# Patient Record
Sex: Female | Born: 1975 | Race: Black or African American | Hispanic: No | Marital: Single | Smoking: Never smoker
Health system: Southern US, Community
[De-identification: ages and names within clinical notes are randomized; demographics above are authoritative.]

## PROBLEM LIST (undated history)

## (undated) DIAGNOSIS — F419 Anxiety disorder, unspecified: Secondary | ICD-10-CM

## (undated) DIAGNOSIS — M549 Dorsalgia, unspecified: Secondary | ICD-10-CM

## (undated) DIAGNOSIS — F32A Depression, unspecified: Secondary | ICD-10-CM

## (undated) DIAGNOSIS — Z91018 Allergy to other foods: Secondary | ICD-10-CM

## (undated) DIAGNOSIS — K219 Gastro-esophageal reflux disease without esophagitis: Secondary | ICD-10-CM

## (undated) DIAGNOSIS — E559 Vitamin D deficiency, unspecified: Secondary | ICD-10-CM

## (undated) DIAGNOSIS — F329 Major depressive disorder, single episode, unspecified: Secondary | ICD-10-CM

## (undated) DIAGNOSIS — G43909 Migraine, unspecified, not intractable, without status migrainosus: Secondary | ICD-10-CM

## (undated) DIAGNOSIS — R079 Chest pain, unspecified: Secondary | ICD-10-CM

## (undated) DIAGNOSIS — J302 Other seasonal allergic rhinitis: Secondary | ICD-10-CM

## (undated) DIAGNOSIS — E739 Lactose intolerance, unspecified: Secondary | ICD-10-CM

## (undated) DIAGNOSIS — K5900 Constipation, unspecified: Secondary | ICD-10-CM

## (undated) DIAGNOSIS — D219 Benign neoplasm of connective and other soft tissue, unspecified: Secondary | ICD-10-CM

## (undated) HISTORY — DX: Depression, unspecified: F32.A

## (undated) HISTORY — DX: Other seasonal allergic rhinitis: J30.2

## (undated) HISTORY — DX: Chest pain, unspecified: R07.9

## (undated) HISTORY — DX: Anxiety disorder, unspecified: F41.9

## (undated) HISTORY — DX: Migraine, unspecified, not intractable, without status migrainosus: G43.909

## (undated) HISTORY — DX: Vitamin D deficiency, unspecified: E55.9

## (undated) HISTORY — DX: Benign neoplasm of connective and other soft tissue, unspecified: D21.9

## (undated) HISTORY — DX: Lactose intolerance, unspecified: E73.9

## (undated) HISTORY — DX: Constipation, unspecified: K59.00

## (undated) HISTORY — DX: Dorsalgia, unspecified: M54.9

## (undated) HISTORY — DX: Allergy to other foods: Z91.018

## (undated) HISTORY — DX: Gastro-esophageal reflux disease without esophagitis: K21.9

---
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
## 2004-04-13 ENCOUNTER — Other Ambulatory Visit: Admission: RE | Admit: 2004-04-13 | Discharge: 2004-04-13 | Payer: Self-pay | Admitting: Obstetrics and Gynecology

## 2005-06-15 ENCOUNTER — Other Ambulatory Visit: Admission: RE | Admit: 2005-06-15 | Discharge: 2005-06-15 | Payer: Self-pay | Admitting: Obstetrics and Gynecology

## 2006-06-24 ENCOUNTER — Other Ambulatory Visit: Admission: RE | Admit: 2006-06-24 | Discharge: 2006-06-24 | Payer: Self-pay | Admitting: Obstetrics and Gynecology

## 2007-07-01 ENCOUNTER — Other Ambulatory Visit: Admission: RE | Admit: 2007-07-01 | Discharge: 2007-07-01 | Payer: Self-pay | Admitting: Obstetrics and Gynecology

## 2008-07-07 ENCOUNTER — Other Ambulatory Visit: Admission: RE | Admit: 2008-07-07 | Discharge: 2008-07-07 | Payer: Self-pay | Admitting: Obstetrics and Gynecology

## 2009-08-17 ENCOUNTER — Other Ambulatory Visit: Admission: RE | Admit: 2009-08-17 | Discharge: 2009-08-17 | Payer: Self-pay | Admitting: Obstetrics and Gynecology

## 2010-09-20 ENCOUNTER — Other Ambulatory Visit
Admission: RE | Admit: 2010-09-20 | Discharge: 2010-09-20 | Payer: Self-pay | Source: Home / Self Care | Admitting: Obstetrics and Gynecology

## 2010-09-20 ENCOUNTER — Other Ambulatory Visit: Payer: Self-pay | Admitting: Obstetrics and Gynecology

## 2011-11-13 ENCOUNTER — Other Ambulatory Visit: Payer: Self-pay | Admitting: Obstetrics and Gynecology

## 2011-11-13 ENCOUNTER — Other Ambulatory Visit (HOSPITAL_COMMUNITY)
Admission: RE | Admit: 2011-11-13 | Discharge: 2011-11-13 | Disposition: A | Discharge status: Home / Self Care | Payer: BC Managed Care – PPO | Source: Ambulatory Visit | Attending: Obstetrics and Gynecology | Admitting: Obstetrics and Gynecology

## 2011-11-13 DIAGNOSIS — Z01419 Encounter for gynecological examination (general) (routine) without abnormal findings: Secondary | ICD-10-CM | POA: Insufficient documentation

## 2012-11-12 ENCOUNTER — Other Ambulatory Visit (HOSPITAL_COMMUNITY)
Admission: RE | Admit: 2012-11-12 | Discharge: 2012-11-12 | Disposition: A | Discharge status: Home / Self Care | Payer: BC Managed Care – PPO | Source: Ambulatory Visit | Attending: Obstetrics and Gynecology | Admitting: Obstetrics and Gynecology

## 2012-11-12 ENCOUNTER — Other Ambulatory Visit: Payer: Self-pay | Admitting: Obstetrics and Gynecology

## 2012-11-12 DIAGNOSIS — Z01419 Encounter for gynecological examination (general) (routine) without abnormal findings: Secondary | ICD-10-CM | POA: Insufficient documentation

## 2012-11-12 DIAGNOSIS — Z1151 Encounter for screening for human papillomavirus (HPV): Secondary | ICD-10-CM | POA: Insufficient documentation

## 2013-08-11 ENCOUNTER — Encounter: Payer: Self-pay | Admitting: Neurology

## 2013-08-11 ENCOUNTER — Ambulatory Visit (INDEPENDENT_AMBULATORY_CARE_PROVIDER_SITE_OTHER): Payer: BC Managed Care – PPO | Admitting: Neurology

## 2013-08-11 VITALS — BP 110/70 | HR 68 | Temp 99.1°F | Ht 65.5 in | Wt 200.0 lb

## 2013-08-11 DIAGNOSIS — G43109 Migraine with aura, not intractable, without status migrainosus: Secondary | ICD-10-CM

## 2013-08-11 MED ORDER — TOPIRAMATE 25 MG PO TABS: 25.0000 mg | ORAL_TABLET | Freq: Every day | ORAL | Status: DC

## 2013-08-11 NOTE — Progress Notes (Signed)
NEUROLOGY CONSULTATION NOTE  Satya Bohall MRN: [id number] DOB: Aug 21, 1976  Referring provider: Dr. Wynelle Link Primary care provider: Dr. Wynelle Link  Reason for consult:  Migraines  HISTORY OF PRESENT ILLNESS: Carolyn Shaw is a 37 year old left-handed woman with history of migraines, uterine fibroids, allergic rhinitis, and GERD who presents for headaches.  Records and images were personally reviewed where available.   Onset:  Adolescence but formally diagnosed in 2002 Location:  Left periorbital region Quality:  Squeezing sensation of the eye followed by stabbing and pounding around the eye. Intensity:  7-8/10 Associated symptoms:  photophobia, nausea, phonophobia.  No osmophobia, eye lacrimation or nasal congestion. Aura:  Visual field loss from the left moving to the right side leaving a left field cut briefly Duration:  All day without medication (1-3 hours with medication) Frequency:  Twice a week over the past 1 to 2 years. Activity:  Lay down but able to force self to be active if needed Triggers/exacerbating factors:  Lights, noise Relieving factors:  Laying down.  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  PAST MEDICAL HISTORY: Past Medical History  Diagnosis Date  . Migraines     PAST SURGICAL HISTORY: No past surgical history on file.  MEDICATIONS: No current outpatient prescriptions on file prior to visit.   No current facility-administered medications on file prior to visit.    ALLERGIES: Allergies  Allergen Reactions  . Chocolate Hives    FAMILY  HISTORY: No family history on file.  SOCIAL HISTORY: History   Social History  . Marital Status: Unknown    Spouse Name: N/A    Number of Children: N/A  . Years of Education: N/A   Occupational History  . Not on file.   Social History Main Topics  . Smoking status: Never Smoker   . Smokeless tobacco: Never Used  . Alcohol Use: No  . Drug Use: No  . Sexual Activity: Not on file   Other Topics Concern  . Not on file   Social History Narrative  . No narrative on file    REVIEW OF SYSTEMS: Constitutional: No fevers, chills, or sweats, no generalized fatigue, change in appetite Eyes: No visual changes, double vision, eye pain Ear, nose and throat: No hearing loss, ear pain, nasal congestion, sore throat Cardiovascular: No chest pain, palpitations Respiratory:  No shortness of breath at rest or with exertion, wheezes GastrointestinaI: No nausea, vomiting, diarrhea, abdominal pain, fecal incontinence Genitourinary:  No dysuria, urinary retention or frequency Musculoskeletal:  No neck pain, back pain Integumentary: No rash, pruritus, skin lesions Neurological: as above Psychiatric: No depression, insomnia, anxiety Endocrine: No palpitations, fatigue, diaphoresis, mood swings, change in appetite, change in weight, increased thirst Hematologic/Lymphatic:  No anemia, purpura, petechiae. Allergic/Immunologic: no itchy/runny eyes, nasal congestion, recent allergic reactions, rashes  PHYSICAL EXAM: Filed Vitals:   08/11/13 0756  BP: 110/70  Pulse: 68  Temp: 99.1 F (37.3 C)   General: No acute distress Head:  Normocephalic/atraumatic Neck: supple, no paraspinal tenderness, full range of motion Back: No paraspinal tenderness Heart: regular rate and rhythm Lungs: Clear to auscultation bilaterally. Vascular: No carotid bruits. Neurological Exam: Mental  status: alert and oriented to person, place, and time, speech fluent and not dysarthric, language intact. Cranial  nerves: CN I: not tested CN II: pupils equal, round and reactive to light, visual fields intact, fundi unremarkable. CN III, IV, VI:  full range of motion, no nystagmus, no ptosis CN V: facial sensation intact CN VII: upper and lower face symmetric CN VIII: hearing intact CN IX, X: gag intact, uvula midline CN XI: sternocleidomastoid and trapezius muscles intact CN XII: tongue midline Bulk & Tone: normal, no fasciculations. Motor: 5/5 throughout Sensation: pinprick and vibration intact Deep Tendon Reflexes: 2+ throughout, toes down Finger to nose testing: normal Heel to shin: normal Gait: normal stance and stride.  Able to walk on toes, heels and in tandem. Romberg negative.  IMPRESSION: Episodic migraine with aura, now more frequent.  PLAN: 1.  Start topamax 25mg  at bedtime.  Side effects discussed.  To call in 4 weeks with update. 2.  For abortive therapy, continue Maxalt but to take an NSAID along with the first dose of Maxalt to see if it aborts it better. 3.  Limit use of pain meds to no more than 2 days out of week. 4.  Follow up in 3 months.   Thank you for allowing me to take part in the care of this patient.  Shon Millet, DO  CC:  Carolyn James, MD

## 2013-08-11 NOTE — Patient Instructions (Signed)
1.  We will start topiramate (Topamax) 25mg  at bedtime.  Possible side effects include: impaired thinking, sedation, paresthesias (numbness and tingling) and weight loss.  It may cause dehydration and there is a small risk for kidney stones, so make sure to stay hydrated with water during the day.  There is also a very small risk for glaucoma, so if you notice any change in your vision while taking this medication, see an ophthalmologist.  There is also a very small risk of possible suicidal ideation, as it the case with all antiepileptic medications.  Pregnancy Guidelines 1. If any medication changes are to be made, they should be completed several months before conception. Lamictal (lamotrigine) and Trileptal (oxcarbazepine) levels, in particular, need to carefully monitored (at least once a month) during pregnancy as drug levels for these two medications tend to decrease by at least 30% during pregnancy. 2. The risk of fetal malformation is increased in women taking seizure medications: However, the risk is lower in Topamax, when compared to older antiepileptic medications.  The risk is approximately 3% compared to 1-2% in the general population. 3. Women with epilepsy planning a pregnancy should take 5 mg/day of folic acid in the preconception period and throughout pregnancy. 4. Vitamin K (20 mg/day) should be used in the last month of pregnancy in women on enzyme-inducing seizure medications (such as Topamax).  Infants should receive 1 mg of vitamin K intramuscularly at birth, to prevent hemorrhagic disease of the newborn. 5. Overbreathing (hyperventilation), sleep deprivation, pain, and emotional stress increase the risk of seizures during labor.  Consider epidural anesthesia early in the labor. 6. All currently available seizure medications can be taken while breast feeding.  2.  Continue Maxalt to abort the headache.  However, take an NSAID (like ibuprofen or naproxen) with the first dose of  Maxalt to see if it aborts the headache quicker so you don't need the second Maxalt.  3.  Take pain meds no more than 2 days out of week to prevent rebound headache. 4.  Follow up in 3 months but call in 4 weeks with update.

## 2013-10-02 ENCOUNTER — Telehealth: Payer: Self-pay | Admitting: Neurology

## 2013-10-19 NOTE — Telephone Encounter (Signed)
Patient wants to know if it is ok to not take Topamax during her monthly cycle ? And she also is asking if Topamax causes you to stay awake at night ? Please  Advise

## 2013-10-19 NOTE — Telephone Encounter (Signed)
Pt called again today and she stated that she has yet to hear from a nurse. She would like to  Speak to a nurse regarding her medication.

## 2013-10-20 NOTE — Telephone Encounter (Signed)
She will stay on the Topamax at this time as it is helping .

## 2013-10-20 NOTE — Telephone Encounter (Signed)
1.  Topamax should not be stopped if taken for migraine prevention.  Therefore, it really should be taken even during her cycle. 2.  It is possible that Topamax may cause insomnia.  Does she wish to stop the Topamax then?  If so, then we can stop the Topamax and start propranolol 40mg  twice daily instead (40mg  tablets twice daily, #60, R 1).  Propranolol is a blood pressure medication that is very effective in treating migraine.  Side effects may include sleepiness, lightheadedness, dizziness and reduced heart rate.  It is commonly used for migraine.

## 2013-11-10 ENCOUNTER — Ambulatory Visit: Payer: BC Managed Care – PPO | Admitting: Neurology

## 2013-11-16 ENCOUNTER — Ambulatory Visit (INDEPENDENT_AMBULATORY_CARE_PROVIDER_SITE_OTHER): Payer: BC Managed Care – PPO | Admitting: Neurology

## 2013-11-16 ENCOUNTER — Ambulatory Visit: Payer: BC Managed Care – PPO | Admitting: Neurology

## 2013-11-16 ENCOUNTER — Encounter: Payer: Self-pay | Admitting: Neurology

## 2013-11-16 VITALS — BP 118/70 | HR 68 | Temp 97.5°F | Resp 18 | Ht 65.5 in | Wt 197.7 lb

## 2013-11-16 DIAGNOSIS — G43109 Migraine with aura, not intractable, without status migrainosus: Secondary | ICD-10-CM

## 2013-11-16 NOTE — Progress Notes (Signed)
NEUROLOGY FOLLOW UP OFFICE NOTE  Carolyn Shaw [id number]  HISTORY OF PRESENT ILLNESS: Carolyn Shaw is a 38 year old left-handed woman with history of migraines, uterine fibroids, allergic rhinitis, and GERD who follows up for episodic migraine with aura.  Records and images were personally reviewed where available.    UPDATE: Started topirmate 08/11/13.  Doing very well.  Last migraine was February 16.  Notes some slight blurred vision.  Getting eyes checked soon.  Current abortive therapy:  Maxalt 10mg , helped when took the second dose. Current preventative therapy:  topiramate 25mg  daily  HISTORY: Onset:  Adolescence but formally diagnosed in 2002 Location:  Left periorbital region Quality:  Squeezing sensation of the eye followed by stabbing and pounding around the eye. Initial Intensity:  7-8/10 Associated symptoms:  photophobia, nausea, phonophobia.  No osmophobia, eye lacrimation or nasal congestion. Aura:  Visual field loss from the left moving to the right side leaving a left field cut briefly Initial Duration:  All day without medication (1-3 hours with medication) Initial Frequency:  Twice a week over the past 1 to 2 years. Activity:  Lay down but able to force self to be active if needed Triggers/exacerbating factors:  Lights, noise Relieving factors:  Laying down.  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  PAST MEDICAL HISTORY: Past Medical History  Diagnosis Date  . Migraines     MEDICATIONS: Current Outpatient Prescriptions on File Prior to Visit  Medication Sig Dispense Refill  . cholecalciferol (VITAMIN D) 1000 UNITS tablet Take 1,000 Units by mouth daily.      . cyclobenzaprine (FLEXERIL) 10 MG tablet Take 10 mg by mouth 3  (three) times daily as needed for muscle spasms.      Marland Kitchen ibuprofen (ADVIL,MOTRIN) 800 MG tablet Take 800 mg by mouth every 8 (eight) hours as needed.      . Multiple Vitamin (MULTIVITAMIN) tablet Take 1 tablet by mouth daily.      . rizatriptan (MAXALT) 10 MG tablet Take 10 mg by mouth as needed for migraine. May repeat in 2 hours if needed      . topiramate (TOPAMAX) 25 MG tablet Take 1 tablet (25 mg total) by mouth at bedtime.  30 tablet  3  . tranexamic acid (LYSTEDA) 650 MG TABS tablet Take 1,300 mg by mouth 3 (three) times daily.       No current facility-administered medications on file prior to visit.    ALLERGIES: Allergies  Allergen Reactions  . Chocolate Hives    FAMILY HISTORY: Family History  Problem Relation Age of Onset  . Ataxia Neg Hx   . Chorea Neg Hx   . Dementia Neg Hx   . Mental retardation Neg Hx   . Migraines Neg Hx   . Multiple sclerosis Neg Hx   . Neurofibromatosis Neg Hx   . Neuropathy Neg Hx   . Parkinsonism Neg Hx   . Seizures Neg Hx   . Stroke Neg Hx     SOCIAL HISTORY: History   Social History  . Marital Status: Unknown    Spouse Name: N/A    Number of Children: N/A  . Years of Education: N/A   Occupational History  . Not on file.   Social History Main Topics  . Smoking status: Never Smoker   .  Smokeless tobacco: Never Used  . Alcohol Use: No  . Drug Use: No  . Sexual Activity: Not on file   Other Topics Concern  . Not on file   Social History Narrative  . No narrative on file    REVIEW OF SYSTEMS: Constitutional: No fevers, chills, or sweats, no generalized fatigue, change in appetite Eyes: No visual changes, double vision, eye pain Ear, nose and throat: No hearing loss, ear pain, nasal congestion, sore throat Cardiovascular: No chest pain, palpitations Respiratory:  No shortness of breath at rest or with exertion, wheezes GastrointestinaI: No nausea, vomiting, diarrhea, abdominal pain, fecal incontinence Genitourinary:  No  dysuria, urinary retention or frequency Musculoskeletal:  No neck pain, back pain Integumentary: No rash, pruritus, skin lesions Neurological: as above Psychiatric: No depression, insomnia, anxiety Endocrine: No palpitations, fatigue, diaphoresis, mood swings, change in appetite, change in weight, increased thirst Hematologic/Lymphatic:  No anemia, purpura, petechiae. Allergic/Immunologic: no itchy/runny eyes, nasal congestion, recent allergic reactions, rashes  PHYSICAL EXAM: Filed Vitals:   11/16/13 0930  BP: 118/70  Pulse: 68  Temp: 97.5 F (36.4 C)  Resp: 18   General: No acute distress Head:  Normocephalic/atraumatic Neck: supple, no paraspinal tenderness, full range of motion Heart:  Regular rate and rhythm Lungs:  Clear to auscultation bilaterally Back: No paraspinal tenderness Neurological Exam: alert and oriented to person, place, and time. Attention span and concentration intact, recent and remote memory intact, fund of knowledge intact.  Speech fluent and not dysarthric, language intact.  CN II-XII intact. Fundoscopic exam unremarkable without vessel changes, exudates, hemorrhages or papilledema.  Bulk and tone normal, muscle strength 5/5 throughout.  Sensation to light touch, temperature and vibration intact.  Deep tendon reflexes 2+ throughout, toes downgoing.  Finger to nose and heel to shin testing intact.  Gait normal, Romberg negative.  IMPRESSION: Episodic migraine with aura.  PLAN: 1.  Continue topiramate 25mg  at bedtime. 2.  Get eyes checked. 3.  Follow up in 3 months.  Metta Clines, DO  CC:  Donald Prose, MD

## 2013-11-16 NOTE — Patient Instructions (Signed)
1.  Continue the Topamax 25mg  at bedtime. 2.  Follow up in 3 months.  Call with questions or concerns.

## 2013-11-23 ENCOUNTER — Other Ambulatory Visit: Payer: Self-pay | Admitting: Obstetrics and Gynecology

## 2013-11-23 ENCOUNTER — Other Ambulatory Visit (HOSPITAL_COMMUNITY)
Admission: RE | Admit: 2013-11-23 | Discharge: 2013-11-23 | Disposition: A | Discharge status: Home / Self Care | Payer: BC Managed Care – PPO | Source: Ambulatory Visit | Attending: Obstetrics and Gynecology | Admitting: Obstetrics and Gynecology

## 2013-11-23 DIAGNOSIS — Z01419 Encounter for gynecological examination (general) (routine) without abnormal findings: Secondary | ICD-10-CM | POA: Insufficient documentation

## 2014-02-15 ENCOUNTER — Encounter: Payer: Self-pay | Admitting: Neurology

## 2014-02-15 ENCOUNTER — Ambulatory Visit (INDEPENDENT_AMBULATORY_CARE_PROVIDER_SITE_OTHER): Payer: BC Managed Care – PPO | Admitting: Neurology

## 2014-02-15 VITALS — BP 116/76 | HR 88 | Ht 65.5 in | Wt 198.3 lb

## 2014-02-15 DIAGNOSIS — G43109 Migraine with aura, not intractable, without status migrainosus: Secondary | ICD-10-CM

## 2014-02-15 NOTE — Progress Notes (Signed)
NEUROLOGY FOLLOW UP OFFICE NOTE  Carolyn Shaw [id number]  HISTORY OF PRESENT ILLNESS: Carolyn Shaw is a 38 year old left-handed woman with history of migraines, uterine fibroids, allergic rhinitis, and GERD who follows up for episodic migraine with aura.    UPDATE: Intensity:  6/10 Duration:  3 hours (waited to take Maxalt) Frequency:  Only 2 headaches in past 3 months  Current abortive therapy:  Maxalt 10mg , helped when took the second dose. 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 Initial Intensity:  7-8/10 Associated symptoms:  photophobia, nausea, phonophobia.  No osmophobia, eye lacrimation or nasal congestion. Aura:  Visual field loss from the left moving to the right side leaving a left field cut briefly Initial Duration:  All day without medication (1-3 hours with medication) Initial Frequency:  Twice a week over the past 1 to 2 years; March:  1 migraine in 3 months Activity:  Lay down but able to force self to be active if needed Triggers/exacerbating factors:  Lights, noise Relieving factors:  Laying down.  Past abortive therapy:  Excedrin (ineffective), NSAIDs (ineffective), Tylenol (ineffective) Past preventative therapy:  none  Family history:  No history of headache.  PAST MEDICAL HISTORY: Past Medical History  Diagnosis Date  . Migraines     MEDICATIONS: Current Outpatient Prescriptions on File Prior to Visit  Medication Sig Dispense Refill  . cholecalciferol (VITAMIN D) 1000 UNITS tablet Take 1,000 Units by mouth daily.      . cyclobenzaprine (FLEXERIL) 10 MG tablet Take 10 mg by  mouth 3 (three) times daily as needed for muscle spasms.      Marland Kitchen ibuprofen (ADVIL,MOTRIN) 800 MG tablet Take 800 mg by mouth every 8 (eight) hours as needed.      . Multiple Vitamin (MULTIVITAMIN) tablet Take 1 tablet by mouth daily.      . rizatriptan (MAXALT) 10 MG tablet Take 10 mg by mouth as needed for migraine. May repeat in 2 hours if needed      . topiramate (TOPAMAX) 25 MG tablet Take 1 tablet (25 mg total) by mouth at bedtime.  30 tablet  3  . tranexamic acid (LYSTEDA) 650 MG TABS tablet Take 1,300 mg by mouth 3 (three) times daily.       No current facility-administered medications on file prior to visit.    ALLERGIES: Allergies  Allergen Reactions  . Chocolate Hives    FAMILY HISTORY: Family History  Problem Relation Age of Onset  . Ataxia Neg Hx   . Chorea Neg Hx   . Dementia Neg Hx   . Mental retardation Neg Hx   . Migraines Neg Hx   . Multiple sclerosis Neg Hx   . Neurofibromatosis Neg Hx   . Neuropathy Neg Hx   . Parkinsonism Neg Hx   . Seizures Neg Hx   . Stroke Neg Hx     SOCIAL HISTORY: History   Social History  . Marital Status: Unknown    Spouse Name: N/A    Number of Children: N/A  . Years of Education: N/A   Occupational History  . Not on file.   Social History Main  Topics  . Smoking status: Never Smoker   . Smokeless tobacco: Never Used  . Alcohol Use: No  . Drug Use: No  . Sexual Activity: Not on file   Other Topics Concern  . Not on file   Social History Narrative  . No narrative on file    REVIEW OF SYSTEMS: Constitutional: No fevers, chills, or sweats, no generalized fatigue, change in appetite Eyes: No visual changes, double vision, eye pain Ear, nose and throat: No hearing loss, ear pain, nasal congestion, sore throat Cardiovascular: No chest pain, palpitations Respiratory:  No shortness of breath at rest or with exertion, wheezes GastrointestinaI: No nausea, vomiting, diarrhea, abdominal pain, fecal  incontinence Genitourinary:  No dysuria, urinary retention or frequency Musculoskeletal:  No neck pain, back pain Integumentary: No rash, pruritus, skin lesions Neurological: as above Psychiatric: No depression, insomnia, anxiety Endocrine: No palpitations, fatigue, diaphoresis, mood swings, change in appetite, change in weight, increased thirst Hematologic/Lymphatic:  No anemia, purpura, petechiae. Allergic/Immunologic: no itchy/runny eyes, nasal congestion, recent allergic reactions, rashes  PHYSICAL EXAM: Filed Vitals:   02/15/14 0955  BP: 116/76  Pulse: 88   General: No acute distress Head:  Normocephalic/atraumatic Neck: supple, no paraspinal tenderness, full range of motion Heart:  Regular rate and rhythm Lungs:  Clear to auscultation bilaterally Back: No paraspinal tenderness Neurological Exam: alert and oriented to person, place, and time. Attention span and concentration intact, recent and remote memory intact, fund of knowledge intact.  Speech fluent and not dysarthric, language intact.  CN II-XII intact. Fundoscopic exam unremarkable without vessel changes, exudates, hemorrhages or papilledema.  Bulk and tone normal, muscle strength 5/5 throughout.  Sensation to light touch, temperature and vibration intact.  Deep tendon reflexes 2+ throughout, toes downgoing.  Finger to nose and heel to shin testing intact.  Gait normal, Romberg negative.  IMPRESSION: Migraine with aura, improved  PLAN: 1.  Continue Topamax 25mg  at bedtime.  Keep hydrated. 2.  Maxalt 10mg  for abortive therapy 3.  Follow up in 6 months or as needed.  Metta Clines, DO  CC:  Donald Prose, MD

## 2014-02-15 NOTE — Patient Instructions (Signed)
1.  We will continue Topamax 25mg  at beditme 2.  Continue Maxalt for when you get a migraine 3.  Follow up in 6 months but call with questions or concerns.

## 2014-06-14 ENCOUNTER — Telehealth: Payer: Self-pay | Admitting: Neurology

## 2014-06-14 NOTE — Telephone Encounter (Signed)
No answer. Did not leave voicemail. 

## 2014-06-14 NOTE — Telephone Encounter (Signed)
Pt needs to talk to someone about rebound headache please call [phone]

## 2014-06-15 ENCOUNTER — Telehealth: Payer: Self-pay | Admitting: *Deleted

## 2014-06-15 NOTE — Telephone Encounter (Signed)
Patient is aware to increase her topamax to 50 mg at HS and to take naproxen with maxalt at the earliest onset of headache

## 2014-06-15 NOTE — Telephone Encounter (Signed)
Patient called stating she has started to have headaches more often she feels the maxalt is working and the headache does go away but just not as quick as she would like for it to . She has forgotten to take the Topamax a couple of times before bed and wakes up with a headache . 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 She is wondering if an increase in the Topamax would help and would like another refill on Maxalt .Please advise

## 2014-06-15 NOTE — Telephone Encounter (Signed)
She can increase topamax to 50mg  at bedtime.  Next time she has a migraine, she can try taking the Maxalt along with naproxen 500mg  to see if it helps stop the headache better.

## 2014-07-21 ENCOUNTER — Telehealth: Payer: Self-pay | Admitting: Neurology

## 2014-07-21 MED ORDER — TOPIRAMATE 25 MG PO TABS: 50.0000 mg | ORAL_TABLET | Freq: Every day | ORAL | Status: DC

## 2014-07-21 NOTE — Telephone Encounter (Signed)
Refill approved and sent to [place] with increased dose of 50 mg at bedtime. Tried to call patient to make her aware but phone number is not working.

## 2014-07-21 NOTE — Telephone Encounter (Signed)
Patient is out of topamax and would like a refill sent into [facility] on [address] pt phone number is [phone]

## 2014-08-16 ENCOUNTER — Ambulatory Visit (INDEPENDENT_AMBULATORY_CARE_PROVIDER_SITE_OTHER): Payer: BC Managed Care – PPO | Admitting: Neurology

## 2014-08-16 ENCOUNTER — Encounter: Payer: Self-pay | Admitting: Neurology

## 2014-08-16 VITALS — BP 130/70 | HR 70 | Temp 98.7°F | Resp 18 | Ht 65.0 in | Wt 198.8 lb

## 2014-08-16 DIAGNOSIS — M5416 Radiculopathy, lumbar region: Secondary | ICD-10-CM

## 2014-08-16 DIAGNOSIS — G43109 Migraine with aura, not intractable, without status migrainosus: Secondary | ICD-10-CM

## 2014-08-16 NOTE — Progress Notes (Signed)
NEUROLOGY FOLLOW UP OFFICE NOTE  Carolyn Shaw [id number]  HISTORY OF PRESENT ILLNESS: Carolyn Shaw is a 38 year old left-handed woman with history of migraines, uterine fibroids, allergic rhinitis, and GERD who follows up for episodic migraine with aura.    UPDATE: She had an increased frequency of headache, which has improved since increasing topamax to 50mg . 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  There is only mild back pain.  She cannot recall any injury or strenuous activity.  HISTORY: Onset:  Adolescence but formally diagnosed in 2002 Location:  Left periorbital region Quality:  Squeezing sensation of the eye followed by stabbing and pounding around the eye. Initial Intensity:  7-8/10; June 6/10 Associated symptoms:  photophobia, nausea, phonophobia.  No osmophobia, eye lacrimation or nasal congestion. Aura:  Visual field loss from the left moving to the right side leaving a left field cut briefly Initial Duration:  All day without medication (1-3 hours with medication); June 3 hours (waited to take Maxalt) Initial Frequency:  Twice a week over the past 1 to 2 years; June only 2 headaches in 3 months. Activity:  Lay down but able to force self to be active if needed Triggers/exacerbating factors:  Lights, noise Relieving factors:  Laying down.  Past abortive therapy:  Excedrin (ineffective), NSAIDs (ineffective), Tylenol (ineffective) Past preventative therapy:  none  Family history:  No history of headache.  PAST MEDICAL HISTORY: Past Medical  History  Diagnosis Date  . Migraines     MEDICATIONS: Current Outpatient Prescriptions on File Prior to Visit  Medication Sig Dispense Refill  . cholecalciferol (VITAMIN D) 1000 UNITS tablet Take 1,000 Units by mouth daily.    . cyclobenzaprine (FLEXERIL) 10 MG tablet Take 10 mg by mouth 3 (three) times daily as needed for muscle spasms.    Marland Kitchen ibuprofen (ADVIL,MOTRIN) 800 MG tablet Take 800 mg by mouth every 8 (eight) hours as needed.    . Multiple Vitamin (MULTIVITAMIN) tablet Take 1 tablet by mouth daily.    . rizatriptan (MAXALT) 10 MG tablet Take 10 mg by mouth as needed for migraine. May repeat in 2 hours if needed    . topiramate (TOPAMAX) 25 MG tablet Take 2 tablets (50 mg total) by mouth at bedtime. 60 tablet 3  . tranexamic acid (LYSTEDA) 650 MG TABS tablet Take 1,300 mg by mouth 3 (three) times daily.     No current facility-administered medications on file prior to visit.    ALLERGIES: Allergies  Allergen Reactions  . Chocolate Hives    FAMILY HISTORY: Family History  Problem Relation Age of Onset  . Ataxia Neg Hx   . Chorea Neg Hx   . Dementia Neg Hx   . Mental retardation Neg Hx   . Migraines Neg Hx   . Multiple sclerosis Neg Hx   . Neurofibromatosis Neg Hx   . Neuropathy Neg Hx   . Parkinsonism Neg Hx   . Seizures Neg Hx   . Stroke Neg Hx     SOCIAL HISTORY: History   Social History  .  Marital Status: Unknown    Spouse Name: N/A    Number of Children: N/A  . Years of Education: N/A   Occupational History  . Not on file.   Social History Main Topics  . Smoking status: Never Smoker   . Smokeless tobacco: Never Used  . Alcohol Use: No  . Drug Use: No  . Sexual Activity:    Partners: Male   Other Topics Concern  . Not on file   Social History Narrative    REVIEW OF SYSTEMS: Constitutional: No fevers, chills, or sweats, no generalized fatigue, change in appetite Eyes: No visual changes, double vision, eye pain Ear, nose and throat: No  hearing loss, ear pain, nasal congestion, sore throat Cardiovascular: No chest pain, palpitations Respiratory:  No shortness of breath at rest or with exertion, wheezes GastrointestinaI: No nausea, vomiting, diarrhea, abdominal pain, fecal incontinence Genitourinary:  No dysuria, urinary retention or frequency Musculoskeletal:  No neck pain, back pain Integumentary: No rash, pruritus, skin lesions Neurological: as above Psychiatric: No depression, insomnia, anxiety Endocrine: No palpitations, fatigue, diaphoresis, mood swings, change in appetite, change in weight, increased thirst Hematologic/Lymphatic:  No anemia, purpura, petechiae. Allergic/Immunologic: no itchy/runny eyes, nasal congestion, recent allergic reactions, rashes  PHYSICAL EXAM: Filed Vitals:   08/16/14 0933  BP: 130/70  Pulse: 70  Temp: 98.7 F (37.1 C)  Resp: 18   General: No acute distress Head:  Normocephalic/atraumatic Eyes:  Fundoscopic exam unremarkable without vessel changes, exudates, hemorrhages or papilledema. Neck: supple, no paraspinal tenderness, full range of motion Heart:  Regular rate and rhythm Lungs:  Clear to auscultation bilaterally Back: No paraspinal tenderness Neurological Exam: alert and oriented to person, place, and time. Attention span and concentration intact, recent and remote memory intact, fund of knowledge intact.  Speech fluent and not dysarthric, language intact.  CN II-XII intact. Fundoscopic exam unremarkable without vessel changes, exudates, hemorrhages or papilledema.  Bulk and tone normal, muscle strength 5/5 throughout.  Sensation to light touch, temperature and vibration intact.  Deep tendon reflexes 2+ throughout, toes downgoing.  Finger to nose and heel to shin testing intact.  Gait normal, Romberg negative.  IMPRESSION: Migraine with aura, improved. Right lumbar radiculopathy   PLAN: 1.  Continue Topamax 50mg   2.  maxalt or Excedrin for abortive therapy 3.  Will ask her  PT friend regarding exercise for lumbar radiculopathy 4.  If leg discomfort continues to get worse, will contact me. 5.  Follow up in 6 months.  15 minutes spent with patient, over 50% spent discussing diagnoses and coordinating care.  Metta Clines, DO  CC: Donald Prose, MD

## 2014-08-16 NOTE — Patient Instructions (Signed)
1.  Take topamax 50mg  at bedtime 2.  Maxalt or Excedrin migraine when you get the headache (take at earliest onset of headache) 3.  Ask the physical therapist about exercises regarding probable pinched nerve in back 4.  Call with questions or concerns. 5.  Follow up in 6 months.

## 2014-11-26 ENCOUNTER — Other Ambulatory Visit: Payer: Self-pay | Admitting: Obstetrics and Gynecology

## 2014-11-26 ENCOUNTER — Other Ambulatory Visit (HOSPITAL_COMMUNITY)
Admission: RE | Admit: 2014-11-26 | Discharge: 2014-11-26 | Disposition: A | Discharge status: Home / Self Care | Payer: BC Managed Care – PPO | Source: Ambulatory Visit | Attending: Obstetrics and Gynecology | Admitting: Obstetrics and Gynecology

## 2014-11-26 DIAGNOSIS — Z01419 Encounter for gynecological examination (general) (routine) without abnormal findings: Secondary | ICD-10-CM | POA: Diagnosis present

## 2014-11-29 LAB — CYTOLOGY - PAP

## 2015-02-15 ENCOUNTER — Ambulatory Visit: Payer: BC Managed Care – PPO | Admitting: Neurology

## 2015-03-14 ENCOUNTER — Encounter: Payer: Self-pay | Admitting: Neurology

## 2015-03-14 ENCOUNTER — Ambulatory Visit (INDEPENDENT_AMBULATORY_CARE_PROVIDER_SITE_OTHER): Payer: BC Managed Care – PPO | Admitting: Neurology

## 2015-03-14 VITALS — BP 122/70 | HR 74 | Resp 16 | Ht 65.5 in | Wt 206.2 lb

## 2015-03-14 DIAGNOSIS — G43109 Migraine with aura, not intractable, without status migrainosus: Secondary | ICD-10-CM

## 2015-03-14 NOTE — Patient Instructions (Signed)
Continue topiramate 50mg  at bedtime Maxalt as needed Take folic acid 1mg  daily Follow up in December

## 2015-03-14 NOTE — Progress Notes (Signed)
NEUROLOGY FOLLOW UP OFFICE NOTE  Carolyn Shaw [id number]  HISTORY OF PRESENT ILLNESS: Carolyn Shaw is a 39 year old left-handed woman with history of migraines, uterine fibroids, allergic rhinitis, and GERD who follows up for episodic migraine with aura.    UPDATE: Stable.  During school, was getting the stabbing eye pain for 2 minutes once a week, but didn't evolve into full migraine.  Since school finished, has not had it.  She still has photosensitivity when going outside or coming in from bright day. 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  There is only mild back pain.  She cannot recall any injury or strenuous activity.  It resolves with stretching  HISTORY: Onset:  Adolescence but formally diagnosed in 2002 Location:  Left periorbital region Quality:  Squeezing sensation of the eye followed by stabbing and pounding around the eye. Initial Intensity:  7-8/10; Dec 2015 5/10 Associated symptoms:  photophobia, nausea, phonophobia.  No osmophobia, eye lacrimation or nasal congestion. Aura:  Visual field loss from the left moving to the right side leaving a left field cut briefly Initial Duration:  All day without medication (1-3 hours with medication); Dec 2015 up to 2 hours Initial Frequency:  Twice a week over the past 1 to 2 years; Dec 2015 twice a month Activity:  Lay down but able to force self to be active if needed Triggers/exacerbating factors:  Lights, noise Relieving factors:  Laying down.  Past abortive therapy:  Excedrin (ineffective), NSAIDs  (ineffective), Tylenol (ineffective) Past preventative therapy:  none  Family history:  No history of headache.  PAST MEDICAL HISTORY: Past Medical History  Diagnosis Date  . Migraines     MEDICATIONS: Current Outpatient Prescriptions on File Prior to Visit  Medication Sig Dispense Refill  . aspirin-acetaminophen-caffeine (EXCEDRIN MIGRAINE) 250-250-65 MG per tablet Take by mouth every 6 (six) hours as needed for headache or migraine.    . cholecalciferol (VITAMIN D) 1000 UNITS tablet Take 1,000 Units by mouth daily.    . cyclobenzaprine (FLEXERIL) 10 MG tablet Take 10 mg by mouth 3 (three) times daily as needed for muscle spasms.    Marland Kitchen ibuprofen (ADVIL,MOTRIN) 800 MG tablet Take 800 mg by mouth every 8 (eight) hours as needed.    . Multiple Vitamin (MULTIVITAMIN) tablet Take 1 tablet by mouth daily.    . rizatriptan (MAXALT) 10 MG tablet Take 10 mg by mouth as needed for migraine. May repeat in 2 hours if needed    . topiramate (TOPAMAX) 25 MG tablet Take 2 tablets (50 mg total) by mouth at bedtime. 60 tablet 3  . tranexamic acid (LYSTEDA) 650 MG TABS tablet Take 1,300 mg by mouth 3 (three) times daily.     No current facility-administered medications on file prior to visit.    ALLERGIES: Allergies  Allergen Reactions  . Chocolate Hives    FAMILY HISTORY: Family History  Problem Relation Age of Onset  . Ataxia Neg Hx   . Chorea Neg Hx   . Dementia Neg Hx   . Mental retardation Neg  Hx   . Migraines Neg Hx   . Multiple sclerosis Neg Hx   . Neurofibromatosis Neg Hx   . Neuropathy Neg Hx   . Parkinsonism Neg Hx   . Seizures Neg Hx   . Stroke Neg Hx     SOCIAL HISTORY: History   Social History  . Marital Status: Unknown    Spouse Name: N/A  . Number of Children: N/A  . Years of Education: N/A   Occupational History  . Not on file.   Social History Main Topics  . Smoking status: Never Smoker   . Smokeless tobacco: Never Used  . Alcohol Use: No  . Drug Use: No   . Sexual Activity:    Partners: Male   Other Topics Concern  . Not on file   Social History Narrative    REVIEW OF SYSTEMS: Constitutional: No fevers, chills, or sweats, no generalized fatigue, change in appetite Eyes: No visual changes, double vision, eye pain Ear, nose and throat: No hearing loss, ear pain, nasal congestion, sore throat Cardiovascular: No chest pain, palpitations Respiratory:  No shortness of breath at rest or with exertion, wheezes GastrointestinaI: No nausea, vomiting, diarrhea, abdominal pain, fecal incontinence Genitourinary:  No dysuria, urinary retention or frequency Musculoskeletal:  No neck pain, back pain Integumentary: No rash, pruritus, skin lesions Neurological: as above Psychiatric: No depression, insomnia, anxiety Endocrine: No palpitations, fatigue, diaphoresis, mood swings, change in appetite, change in weight, increased thirst Hematologic/Lymphatic:  No anemia, purpura, petechiae. Allergic/Immunologic: no itchy/runny eyes, nasal congestion, recent allergic reactions, rashes  PHYSICAL EXAM: Filed Vitals:   03/14/15 1015  BP: 122/70  Pulse: 74  Resp: 16   General: No acute distress.  Patient appears well-groomed.   Head:  Normocephalic/atraumatic Eyes:  Fundoscopic exam unremarkable without vessel changes, exudates, hemorrhages or papilledema. Neck: supple, no paraspinal tenderness, full range of motion Heart:  Regular rate and rhythm Lungs:  Clear to auscultation bilaterally Back: No paraspinal tenderness Neurological Exam: alert and oriented to person, place, and time. Attention span and concentration intact, recent and remote memory intact, fund of knowledge intact.  Speech fluent and not dysarthric, language intact.  CN II-XII intact. Fundoscopic exam unremarkable without vessel changes, exudates, hemorrhages or papilledema.  Bulk and tone normal, muscle strength 5/5 throughout.  Sensation to light touch, temperature and vibration intact.   Deep tendon reflexes 2+ throughout, toes downgoing.  Finger to nose and heel to shin testing intact.  Gait normal, Romberg negative.  IMPRESSION: Migraine with aura, improved. Low back pain with right lumbar radiculopathy  PLAN: 1.  Continue topiramate 50mg  2.  maxalt as needed 3.  Consider PT if stretches not effective 4.  Follow up in 6 months.  15 minutes spent face to face with patient, over 50% spent discussing management.  Metta Clines, DO  CC:  Donald Prose, MD

## 2015-08-08 ENCOUNTER — Ambulatory Visit (INDEPENDENT_AMBULATORY_CARE_PROVIDER_SITE_OTHER): Payer: BC Managed Care – PPO | Admitting: Neurology

## 2015-08-08 ENCOUNTER — Encounter: Payer: Self-pay | Admitting: Neurology

## 2015-08-08 VITALS — BP 126/84 | HR 88 | Ht 65.0 in | Wt 202.3 lb

## 2015-08-08 DIAGNOSIS — G43109 Migraine with aura, not intractable, without status migrainosus: Secondary | ICD-10-CM

## 2015-08-08 MED ORDER — TOPIRAMATE 50 MG PO TABS: 50.0000 mg | ORAL_TABLET | Freq: Every day | ORAL | Status: DC

## 2015-08-08 MED ORDER — RIZATRIPTAN BENZOATE 10 MG PO TABS: 10.0000 mg | ORAL_TABLET | ORAL | Status: DC | PRN

## 2015-08-08 NOTE — Progress Notes (Signed)
Chart forwarded.  

## 2015-08-08 NOTE — Patient Instructions (Signed)
1.  Refilled topiramate.  I sent 50mg  tablets.  So take 1 tablet at bedtime.   2.  Take folic acid 5mg  daily.  Do not get pregnant while on topiramate. 3.  With first dose of maxalt, take with 500mg  of naproxen to see if it makes a different 4.  Follow up in one year

## 2015-08-08 NOTE — Progress Notes (Signed)
NEUROLOGY FOLLOW UP OFFICE NOTE  Carolyn Shaw [id number]  HISTORY OF PRESENT ILLNESS: Carolyn Shaw is a 39 year old left-handed woman with history of migraines, uterine fibroids, allergic rhinitis, and GERD who follows up for episodic migraine with aura.    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  There is only mild back pain.  She cannot recall any injury or strenuous activity.  It resolves with stretching  HISTORY: Onset:  Adolescence but formally diagnosed in 2002 Location:  Left periorbital region Quality:  Squeezing sensation of the eye followed by stabbing and pounding around the eye. Initial Intensity:  7-8/10; July 5/10 Associated symptoms:  photophobia, nausea, phonophobia.  No osmophobia, eye lacrimation or nasal congestion. Aura:  Visual field loss from the left moving to the right side leaving a left field cut briefly Initial Duration:  All day without medication (1-3 hours with medication); July up to 2 hours Initial Frequency:  Twice a week over the past 1 to 2 years; July 2 times per month Activity:  Lay down but able to force self to be active if needed Triggers/exacerbating factors:  Lights, noise Relieving factors:  Laying down.  Past abortive therapy:  Excedrin (ineffective), NSAIDs (ineffective), Tylenol (ineffective) Past preventative therapy:  none  Family history:  No history of headache.  PAST MEDICAL HISTORY: Past Medical History  Diagnosis Date  . Migraines      MEDICATIONS: Current Outpatient Prescriptions on File Prior to Visit  Medication Sig Dispense Refill  . aspirin-acetaminophen-caffeine (EXCEDRIN MIGRAINE) 250-250-65 MG per tablet Take by mouth every 6 (six) hours as needed for headache or migraine.    . cholecalciferol (VITAMIN D) 1000 UNITS tablet Take 1,000 Units by mouth daily.    . cyclobenzaprine (FLEXERIL) 10 MG tablet Take 10 mg by mouth 3 (three) times daily as needed for muscle spasms.    Marland Kitchen ibuprofen (ADVIL,MOTRIN) 800 MG tablet Take 800 mg by mouth every 8 (eight) hours as needed.    . Multiple Vitamin (MULTIVITAMIN) tablet Take 1 tablet by mouth daily.    . tranexamic acid (LYSTEDA) 650 MG TABS tablet Take 1,300 mg by mouth 3 (three) times daily.     No current facility-administered medications on file prior to visit.    ALLERGIES: Allergies  Allergen Reactions  . Chocolate Hives    FAMILY HISTORY: Family History  Problem Relation Age of Onset  . Ataxia Neg Hx   . Chorea Neg Hx   . Dementia Neg Hx   . Mental retardation Neg Hx   . Migraines Neg Hx   . Multiple sclerosis Neg Hx   . Neurofibromatosis Neg Hx   . Neuropathy Neg Hx   . Parkinsonism Neg Hx   . Seizures Neg Hx   . Stroke Neg Hx     SOCIAL HISTORY: Social History   Social History  . Marital Status: Unknown    Spouse Name: N/A  . Number of Children: N/A  . Years of Education: N/A   Occupational History  .  Not on file.   Social History Main Topics  . Smoking status: Never Smoker   . Smokeless tobacco: Never Used  . Alcohol Use: No  . Drug Use: No  . Sexual Activity:    Partners: Male   Other Topics Concern  . Not on file   Social History Narrative    REVIEW OF SYSTEMS: Constitutional: No fevers, chills, or sweats, no generalized fatigue, change in appetite Eyes: No visual changes, double vision, eye pain Ear, nose and throat: No hearing loss, ear pain, nasal congestion, sore throat Cardiovascular: No chest pain,  palpitations Respiratory:  No shortness of breath at rest or with exertion, wheezes GastrointestinaI: No nausea, vomiting, diarrhea, abdominal pain, fecal incontinence Genitourinary:  No dysuria, urinary retention or frequency Musculoskeletal:  No neck pain, back pain Integumentary: No rash, pruritus, skin lesions Neurological: as above Psychiatric: No depression, insomnia, anxiety Endocrine: No palpitations, fatigue, diaphoresis, mood swings, change in appetite, change in weight, increased thirst Hematologic/Lymphatic:  No anemia, purpura, petechiae. Allergic/Immunologic: no itchy/runny eyes, nasal congestion, recent allergic reactions, rashes  PHYSICAL EXAM: Filed Vitals:   08/08/15 1428  BP: 126/84  Pulse: 88   General: No acute distress.  Patient appears well-groomed.   Head:  Normocephalic/atraumatic Eyes:  Fundoscopic exam unremarkable without vessel changes, exudates, hemorrhages or papilledema. Neck: supple, no paraspinal tenderness, full range of motion Heart:  Regular rate and rhythm Lungs:  Clear to auscultation bilaterally Back: No paraspinal tenderness Neurological Exam: alert and oriented to person, place, and time. Attention span and concentration intact, recent and remote memory intact, fund of knowledge intact.  Speech fluent and not dysarthric, language intact.  CN II-XII intact. Fundoscopic exam unremarkable without vessel changes, exudates, hemorrhages or papilledema.  Bulk and tone normal, muscle strength 5/5 throughout.  Sensation to light touch intact.  Deep tendon reflexes 2+ throughout.  Finger to nose and heel to shin testing intact.  Gait normal.  IMPRESSION: Migraine with aura  PLAN: 1.  Continue topiramate 50mg  at bedtime.  Advised to take folic acid 5mg  daily and not to get pregnant while on it. 2.  Advised to try taking first dose of Maxalt 10mg  with naproxen 500mg  to see if headache aborts faster 3.  Follow up in one year  15 minutes spent face to  face with patient, over 50% spent discussing management.  Metta Clines, DO  CC:  Donald Prose, MD

## 2015-10-05 ENCOUNTER — Encounter: Payer: Self-pay | Admitting: Neurology

## 2015-12-19 ENCOUNTER — Other Ambulatory Visit (HOSPITAL_COMMUNITY)
Admission: RE | Admit: 2015-12-19 | Discharge: 2015-12-19 | Disposition: A | Discharge status: Home / Self Care | Payer: BC Managed Care – PPO | Source: Ambulatory Visit | Attending: Obstetrics and Gynecology | Admitting: Obstetrics and Gynecology

## 2015-12-19 ENCOUNTER — Other Ambulatory Visit: Payer: Self-pay | Admitting: Obstetrics and Gynecology

## 2015-12-19 DIAGNOSIS — Z01419 Encounter for gynecological examination (general) (routine) without abnormal findings: Secondary | ICD-10-CM | POA: Diagnosis present

## 2015-12-19 DIAGNOSIS — Z1151 Encounter for screening for human papillomavirus (HPV): Secondary | ICD-10-CM | POA: Insufficient documentation

## 2015-12-21 LAB — CYTOLOGY - PAP

## 2016-08-07 ENCOUNTER — Encounter: Payer: Self-pay | Admitting: Neurology

## 2016-08-07 ENCOUNTER — Ambulatory Visit (INDEPENDENT_AMBULATORY_CARE_PROVIDER_SITE_OTHER): Payer: BC Managed Care – PPO | Admitting: Neurology

## 2016-08-07 VITALS — BP 122/68 | HR 95 | Ht 65.5 in | Wt 202.0 lb

## 2016-08-07 DIAGNOSIS — G43109 Migraine with aura, not intractable, without status migrainosus: Secondary | ICD-10-CM | POA: Diagnosis not present

## 2016-08-07 MED ORDER — TOPIRAMATE 50 MG PO TABS: 50.0000 mg | ORAL_TABLET | Freq: Every day | ORAL | 3 refills | Status: DC

## 2016-08-07 MED ORDER — RIZATRIPTAN BENZOATE 10 MG PO TABS: 10.0000 mg | ORAL_TABLET | ORAL | 3 refills | Status: DC | PRN

## 2016-08-07 NOTE — Progress Notes (Signed)
NEUROLOGY FOLLOW UP OFFICE NOTE  Holland Kottler OR:[id number]  HISTORY OF PRESENT ILLNESS: Carolyn Shaw is a 40 year old left-handed woman with history of migraines, uterine fibroids, allergic rhinitis, and GERD who follows up for episodic migraine with aura.     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  Therefore, migraines are slightly increased in frequency. Intensity:  5/10 Duration:  up to two hours with Maxalt Frequency:  2 days per month.   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  Hydrates. Exercise:  Not as much because she has been more busy   She also reports occasional shooting pain from the hip down the lateral right leg, with numbness in the calf.  There is only mild back pain.  She cannot recall any injury or strenuous activity.  It resolves with stretching   HISTORY: Onset:  Adolescence but formally diagnosed in 2002 Location:  Left periorbital region Quality:  Squeezing sensation of the eye followed by stabbing and pounding around the eye. Initial Intensity:  7-8/10; December 2016: 5/10 Associated symptoms:  photophobia, nausea, phonophobia.  No osmophobia, eye lacrimation or nasal congestion. Aura:  Visual field loss from the left moving to the right side leaving a left field cut briefly Initial Duration:  All day without medication (1-3 hours with medication); December 2016: up to 2 hours with Maxalt (often needs to take second dose) Initial Frequency:  Twice a week over the past 1 to 2 years; December 2016: once a month Activity:  Lay down but able to force self to be active if needed Triggers/exacerbating factors:  Lights, noise Relieving factors:  Laying down.   Past abortive therapy:  Excedrin (ineffective),  NSAIDs (ineffective), Tylenol (ineffective) Past preventative therapy:  none   Family history:  No history of headache.  PAST MEDICAL HISTORY: Past Medical History:  Diagnosis Date  . Migraines     MEDICATIONS: Current Outpatient Prescriptions on File Prior to Visit  Medication Sig Dispense Refill  . aspirin-acetaminophen-caffeine (EXCEDRIN MIGRAINE) 250-250-65 MG per tablet Take by mouth every 6 (six) hours as needed for headache or migraine.    . cholecalciferol (VITAMIN D) 1000 UNITS tablet Take 1,000 Units by mouth daily.    . cyclobenzaprine (FLEXERIL) 10 MG tablet Take 10 mg by mouth 3 (three) times daily as needed for muscle spasms.    Marland Kitchen ibuprofen (ADVIL,MOTRIN) 800 MG tablet Take 800 mg by mouth every 8 (eight) hours as needed.    . Multiple Vitamin (MULTIVITAMIN) tablet Take 1 tablet by mouth daily.    . tranexamic acid (LYSTEDA) 650 MG TABS tablet Take 1,300 mg by mouth 3 (three) times daily.     No current facility-administered medications on file prior to visit.     ALLERGIES: Allergies  Allergen Reactions  . Chocolate Hives    FAMILY HISTORY: Family History  Problem Relation Age of Onset  . Ataxia Neg Hx   . Chorea Neg Hx   . Dementia Neg Hx   . Mental retardation Neg Hx   . Migraines Neg Hx   . Multiple sclerosis Neg Hx   . Neurofibromatosis Neg Hx   . Neuropathy Neg Hx   . Parkinsonism Neg Hx   . Seizures Neg Hx   . Stroke Neg Hx  SOCIAL HISTORY: Social History   Social History  . Marital status: Unknown    Spouse name: N/A  . Number of children: N/A  . Years of education: N/A   Occupational History  . Not on file.   Social History Main Topics  . Smoking status: Never Smoker  . Smokeless tobacco: Never Used  . Alcohol use No  . Drug use: No  . Sexual activity: Yes    Partners: Male   Other Topics Concern  . Not on file   Social History Narrative  . No narrative on file    REVIEW OF SYSTEMS: Constitutional: No fevers, chills,  or sweats, no generalized fatigue, change in appetite Eyes: No visual changes, double vision, eye pain Ear, nose and throat: No hearing loss, ear pain, nasal congestion, sore throat Cardiovascular: No chest pain, palpitations Respiratory:  No shortness of breath at rest or with exertion, wheezes GastrointestinaI: No nausea, vomiting, diarrhea, abdominal pain, fecal incontinence Genitourinary:  No dysuria, urinary retention or frequency Musculoskeletal:  No neck pain, back pain Integumentary: No rash, pruritus, skin lesions Neurological: as above Psychiatric: No depression, insomnia, anxiety Endocrine: No palpitations, fatigue, diaphoresis, mood swings, change in appetite, change in weight, increased thirst Hematologic/Lymphatic:  No purpura, petechiae. Allergic/Immunologic: no itchy/runny eyes, nasal congestion, recent allergic reactions, rashes  PHYSICAL EXAM: Vitals:   08/07/16 1356  BP: 122/68  Pulse: 95   General: No acute distress.  Patient appears well-groomed.   Head:  Normocephalic/atraumatic Eyes:  Fundi examined but not visualized Neck: supple, no paraspinal tenderness, full range of motion Heart:  Regular rate and rhythm Lungs:  Clear to auscultation bilaterally Back: No paraspinal tenderness Neurological Exam: alert and oriented to person, place, and time. Attention span and concentration intact, recent and remote memory intact, fund of knowledge intact.  Speech fluent and not dysarthric, language intact.  CN II-XII intact. Bulk and tone normal, muscle strength 5/5 throughout.  Sensation to light touch  intact.  Deep tendon reflexes 2+ throughout.  Finger to nose testing intact.  Gait normal  IMPRESSION: Migraine without aura  PLAN: 1.  She will work on lifestyle modification:  Routine exercise, improve sleep hygiene, stress management 2.  She may take Maxalt with naproxen if needed (sometimes more effective) 3.  Continue topiramate 50mg  at bedtime 4.  Follow up in  one year or as needed.  26 minutes spent face to face with patient, over 50% spent discussing lifestyle modification and medication management.  Metta Clines, DO  CC:  Donald Prose, MD

## 2016-08-07 NOTE — Patient Instructions (Signed)
Migraine Recommendations: 1. Continue topiramate 50mg  at bedtime 2.  Take Maxalt (with or without naproxen/Aleve) at earliest onset of headache.  May repeat dose once after 2 hours if needed.  Do not exceed two doses in 24 hours. 3.  Limit use of pain relievers to no more than 2 days out of the week.  These medications include acetaminophen, ibuprofen, triptans and narcotics.  This will help reduce risk of rebound headaches. 4.  Be aware of common food triggers such as processed sweets, processed foods with nitrites (such as deli meat, hot dogs, sausages), foods with MSG, alcohol (such as wine), chocolate, certain cheeses, certain fruits (dried fruits, some citrus fruit), vinegar, diet soda. 4.  Avoid caffeine 5.  Routine exercise 6.  Proper sleep hygiene 7.  Stay adequately hydrated with water 8.  Keep a headache diary. 9.  Maintain proper stress management. 10.  Do not skip meals. 11.  Consider supplements:  Magnesium citrate 400mg  to 600mg  daily, riboflavin 400mg , Coenzyme Q 10 100mg  three times daily

## 2017-08-07 ENCOUNTER — Ambulatory Visit: Payer: BC Managed Care – PPO | Admitting: Neurology

## 2017-08-07 ENCOUNTER — Encounter: Payer: Self-pay | Admitting: Neurology

## 2017-08-07 VITALS — BP 122/74 | HR 98 | Ht 65.5 in | Wt 216.0 lb

## 2017-08-07 DIAGNOSIS — G43109 Migraine with aura, not intractable, without status migrainosus: Secondary | ICD-10-CM

## 2017-08-07 MED ORDER — TOPIRAMATE 50 MG PO TABS: 50.0000 mg | ORAL_TABLET | Freq: Every day | ORAL | 3 refills | Status: DC

## 2017-08-07 MED ORDER — RIZATRIPTAN BENZOATE 10 MG PO TABS: 10.0000 mg | ORAL_TABLET | ORAL | 3 refills | Status: DC | PRN

## 2017-08-07 NOTE — Patient Instructions (Signed)
1.  Continue topiramate 50mg  at bedtime 2.  Use rizatriptan or Excedrin as needed, limited to no more than 2 days out of the week 3.  Follow up in one year

## 2017-08-07 NOTE — Progress Notes (Signed)
NEUROLOGY FOLLOW UP OFFICE NOTE  Carolyn Shaw [id number]  HISTORY OF PRESENT ILLNESS: Carolyn Shaw is a 41 year old left-handed woman with history of migraines, uterine fibroids, allergic rhinitis, and GERD who follows up for episodic migraine with aura.     UPDATE: Intensity:  5/10 Duration:  up to two hours with Maxalt or Excedrin Frequency:  once a week   Current abortive therapy:  Maxalt 10mg , Excedrin Migraine Current preventative therapy:  topiramate 50mg  daily   Caffeine:  Very little Sleep hygiene:  Improved since seeing a therapist for anxiety. 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   Diet:  okay.  Hydrates. Exercise:  Not as much because she has been more busy   HISTORY: Onset:  Adolescence but formally diagnosed in 2002 Location:  Left periorbital region Quality:  Squeezing sensation of the eye followed by stabbing and pounding around the eye. Initial Intensity:  7-8/10; December 2017: 5/10 Associated symptoms:  photophobia, nausea, phonophobia.  No osmophobia, eye lacrimation or nasal congestion. Aura:  Visual field loss from the left moving to the right side leaving a left field cut briefly Initial Duration:  All day without medication (1-3 hours with medication); December 2017: up to 2 hours with Maxalt (often needs to take second dose) Initial Frequency:  Twice a week; December 2017: 2 days per month Activity:  Lay down but able to force self to be active if needed Triggers/exacerbating factors:  Lights, noise Relieving factors:  Laying down.   Past abortive therapy:  Excedrin (ineffective), NSAIDs (ineffective), Tylenol (ineffective) Past preventative therapy:  none   Family history:  No history of headache.  PAST MEDICAL HISTORY: Past Medical History:  Diagnosis Date  . Migraines     MEDICATIONS: Current  Outpatient Medications on File Prior to Visit  Medication Sig Dispense Refill  . aspirin-acetaminophen-caffeine (EXCEDRIN MIGRAINE) 250-250-65 MG per tablet Take by mouth every 6 (six) hours as needed for headache or migraine.    . cholecalciferol (VITAMIN D) 1000 UNITS tablet Take 1,000 Units by mouth daily.    . cyclobenzaprine (FLEXERIL) 10 MG tablet Take 10 mg by mouth 3 (three) times daily as needed for muscle spasms.    Marland Kitchen ibuprofen (ADVIL,MOTRIN) 800 MG tablet Take 800 mg by mouth every 8 (eight) hours as needed.    . Multiple Vitamin (MULTIVITAMIN) tablet Take 1 tablet by mouth daily.    . tranexamic acid (LYSTEDA) 650 MG TABS tablet Take 1,300 mg by mouth 3 (three) times daily.     No current facility-administered medications on file prior to visit.     ALLERGIES: Allergies  Allergen Reactions  . Chocolate Hives    FAMILY HISTORY: Family History  Problem Relation Age of Onset  . Ataxia Neg Hx   . Chorea Neg Hx   . Dementia Neg Hx   . Mental retardation Neg Hx   . Migraines Neg Hx   . Multiple sclerosis Neg Hx   . Neurofibromatosis Neg Hx   . Neuropathy Neg Hx   . Parkinsonism Neg Hx   . Seizures Neg Hx   . Stroke Neg Hx     SOCIAL HISTORY: Social History   Socioeconomic History  . Marital status: Unknown    Spouse name: Not on file  . Number of children: Not on file  . Years of education: Not on file  .  Highest education level: Not on file  Social Needs  . Financial resource strain: Not on file  . Food insecurity - worry: Not on file  . Food insecurity - inability: Not on file  . Transportation needs - medical: Not on file  . Transportation needs - non-medical: Not on file  Occupational History  . Not on file  Tobacco Use  . Smoking status: Never Smoker  . Smokeless tobacco: Never Used  Substance and Sexual Activity  . Alcohol use: No    Alcohol/week: 0.0 oz  . Drug use: No  . Sexual activity: Yes    Partners: Male  Other Topics Concern  . Not on  file  Social History Narrative  . Not on file    REVIEW OF SYSTEMS: Constitutional: No fevers, chills, or sweats, no generalized fatigue, change in appetite Eyes: No visual changes, double vision, eye pain Ear, nose and throat: No hearing loss, ear pain, nasal congestion, sore throat Cardiovascular: No chest pain, palpitations Respiratory:  No shortness of breath at rest or with exertion, wheezes GastrointestinaI: No nausea, vomiting, diarrhea, abdominal pain, fecal incontinence Genitourinary:  No dysuria, urinary retention or frequency Musculoskeletal:  No neck pain, back pain Integumentary: No rash, pruritus, skin lesions Neurological: as above Psychiatric: No depression, insomnia, anxiety Endocrine: No palpitations, fatigue, diaphoresis, mood swings, change in appetite, change in weight, increased thirst Hematologic/Lymphatic:  No purpura, petechiae. Allergic/Immunologic: no itchy/runny eyes, nasal congestion, recent allergic reactions, rashes  PHYSICAL EXAM: Vitals:   08/07/17 1432  BP: 122/74  Pulse: 98  SpO2: 95%   General: No acute distress.  Patient appears well-groomed.   Head:  Normocephalic/atraumatic Eyes:  Fundi examined but not visualized Neck: supple, no paraspinal tenderness, full range of motion Heart:  Regular rate and rhythm Lungs:  Clear to auscultation bilaterally Back: No paraspinal tenderness Neurological Exam: alert and oriented to person, place, and time. Attention span and concentration intact, recent and remote memory intact, fund of knowledge intact.  Speech fluent and not dysarthric, language intact.  CN II-XII intact. Bulk and tone normal, muscle strength 5/5 throughout.  Sensation to light touch  intact.  Deep tendon reflexes 2+ throughout.  Finger to nose testing intact.  Gait normal  IMPRESSION: migraine  PLAN: 1.  Continue topiramate 50mg  at bedtime (refilled today) 2.  Excedrin or Maxalt as needed (refilled today) 3.  Follow up in one year  or as needed.  Metta Clines, DO  CC:  Donald Prose, MD

## 2018-03-07 LAB — GLUCOSE, POCT (MANUAL RESULT ENTRY): POC Glucose: 102 mg/dl — AB (ref 70–99)

## 2018-07-28 ENCOUNTER — Encounter: Payer: Self-pay | Admitting: Neurology

## 2018-07-28 NOTE — Progress Notes (Signed)
NEUROLOGY FOLLOW UP OFFICE NOTE  Carolyn Shaw [id number]  HISTORY OF PRESENT ILLNESS: Carolyn Shaw is a 42 year old left-handed woman with migraines, uterine fibroids, allergic rhinitis and GERD who follows up for migraine.  UPDATE: Intensity:  5/10 Duration:  Up to two hours with Maxalt or Excedrin Frequency:  1 to 2 in past month. Frequency of abortive medication: 1 to 2 days in 30 minutes. Rescue protocol:  Excedrin first line, Maxalt 2nd line. Current NSAIDS:  None Current analgesics:  Excedrin Current triptans: Maxalt 10 mg Current ergotamine: None Current anti-emetic: None Current muscle relaxants: None Current anti-anxiolytic: None Current sleep aide: None Current Antihypertensive medications: None Current Antidepressant medications: None Current Anticonvulsant medications: Topiramate 50 mg daily Current anti-CGRP: None Current Vitamins/Herbal/Supplements: None Current Antihistamines/Decongestants: None Other therapy: None Hormone/birth control: No  Caffeine:  Very little Diet: Hydrates Depression: No; Anxiety:  Yes Other pain: No Sleep hygiene: Okay  HISTORY: Onset: Adolescence but formally diagnosed in 2002 Location: Left periorbital region Quality: Squeezing sensation of the eye followed by stabbing and pounding around the eye. Initial Intensity: 7-8/10 Associated symptoms: photophobia, nausea, phonophobia. No osmophobia, eye lacrimation or nasal congestion. Aura: Visual field loss from the left moving to the right side leaving a left field cut briefly Initial Duration: All day without medication (1-3 hours with medication) Initial Frequency: Twice a week Activity: Lay down but able to force self to be active if needed Triggers/aggravating factors: Lights, noise Relieving factors: Laying down.  Past NSAIDs: Ibuprofen, naproxen Past analgesics: Excedrin, Tylenol Past preventative therapy: none  Family history: No history of  headache.  PAST MEDICAL HISTORY: Past Medical History:  Diagnosis Date  . Migraines     MEDICATIONS: Current Outpatient Medications on File Prior to Visit  Medication Sig Dispense Refill  . aspirin-acetaminophen-caffeine (EXCEDRIN MIGRAINE) 250-250-65 MG per tablet Take by mouth every 6 (six) hours as needed for headache or migraine.    . cholecalciferol (VITAMIN D) 1000 UNITS tablet Take 1,000 Units by mouth daily.    . cyclobenzaprine (FLEXERIL) 10 MG tablet Take 10 mg by mouth 3 (three) times daily as needed for muscle spasms.    Marland Kitchen ibuprofen (ADVIL,MOTRIN) 800 MG tablet Take 800 mg by mouth every 8 (eight) hours as needed.    . Multiple Vitamin (MULTIVITAMIN) tablet Take 1 tablet by mouth daily.    . rizatriptan (MAXALT) 10 MG tablet Take 1 tablet (10 mg total) by mouth as needed for migraine. May repeat in 2 hours if needed 30 tablet 3  . topiramate (TOPAMAX) 50 MG tablet Take 1 tablet (50 mg total) by mouth at bedtime. 90 tablet 3  . tranexamic acid (LYSTEDA) 650 MG TABS tablet Take 1,300 mg by mouth 3 (three) times daily.     No current facility-administered medications on file prior to visit.     ALLERGIES: Allergies  Allergen Reactions  . Chocolate Hives    FAMILY HISTORY: Family History  Problem Relation Age of Onset  . Ataxia Neg Hx   . Chorea Neg Hx   . Dementia Neg Hx   . Mental retardation Neg Hx   . Migraines Neg Hx   . Multiple sclerosis Neg Hx   . Neurofibromatosis Neg Hx   . Neuropathy Neg Hx   . Parkinsonism Neg Hx   . Seizures Neg Hx   . Stroke Neg Hx     SOCIAL HISTORY: Social History   Socioeconomic History  . Marital status: Unknown    Spouse name: Not on file  .  Number of children: Not on file  . Years of education: Not on file  . Highest education level: Not on file  Occupational History  . Not on file  Social Needs  . Financial resource strain: Not on file  . Food insecurity:    Worry: Not on file    Inability: Not on file  .  Transportation needs:    Medical: Not on file    Non-medical: Not on file  Tobacco Use  . Smoking status: Never Smoker  . Smokeless tobacco: Never Used  Substance and Sexual Activity  . Alcohol use: No    Alcohol/week: 0.0 standard drinks  . Drug use: No  . Sexual activity: Yes    Partners: Male  Lifestyle  . Physical activity:    Days per week: Not on file    Minutes per session: Not on file  . Stress: Not on file  Relationships  . Social connections:    Talks on phone: Not on file    Gets together: Not on file    Attends religious service: Not on file    Active member of club or organization: Not on file    Attends meetings of clubs or organizations: Not on file    Relationship status: Not on file  . Intimate partner violence:    Fear of current or ex partner: Not on file    Emotionally abused: Not on file    Physically abused: Not on file    Forced sexual activity: Not on file  Other Topics Concern  . Not on file  Social History Narrative  . Not on file    REVIEW OF SYSTEMS: Constitutional: No fevers, chills, or sweats, no generalized fatigue, change in appetite Eyes: No visual changes, double vision, eye pain Ear, nose and throat: No hearing loss, ear pain, nasal congestion, sore throat Cardiovascular: No chest pain, palpitations Respiratory:  No shortness of breath at rest or with exertion, wheezes GastrointestinaI: No nausea, vomiting, diarrhea, abdominal pain, fecal incontinence Genitourinary:  No dysuria, urinary retention or frequency Musculoskeletal:  No neck pain, back pain Integumentary: No rash, pruritus, skin lesions Neurological: as above Psychiatric: No depression, insomnia, anxiety Endocrine: No palpitations, fatigue, diaphoresis, mood swings, change in appetite, change in weight, increased thirst Hematologic/Lymphatic:  No purpura, petechiae. Allergic/Immunologic: no itchy/runny eyes, nasal congestion, recent allergic reactions, rashes  PHYSICAL  EXAM: Blood pressure 104/72, pulse 82, height 5' 5.5" (1.664 m), weight 216 lb (98 kg), SpO2 94 %.  General: No acute distress.  Patient appears well-groomed.  Head:  Normocephalic/atraumatic Eyes:  Fundi examined but not visualized Neck: supple, no paraspinal tenderness, full range of motion Heart:  Regular rate and rhythm Lungs:  Clear to auscultation bilaterally Back: No paraspinal tenderness Neurological Exam: alert and oriented to person, place, and time. Attention span and concentration intact, recent and remote memory intact, fund of knowledge intact.  Speech fluent and not dysarthric, language intact.  CN II-XII intact. Bulk and tone normal, muscle strength 5/5 throughout.  Sensation to light touch  intact.  Deep tendon reflexes 2+ throughout, toes downgoing.  Finger to nose  testing intact.  Gait normal, Romberg negative.  IMPRESSION: Migraine with aura, without status migrainosus, not intractable  PLAN: 1.  For preventative management, continue topiramate 50mg  at bedtime 2.  For abortive therapy, Excedrin and/or Maxalt 3.  Limit use of pain relievers to no more than 2 days out of week to prevent risk of rebound or medication-overuse headache. 4.  Keep headache diary  5.  Exercise, hydration, caffeine cessation, sleep hygiene, monitor for and avoid triggers 6.  Consider:  magnesium citrate 400mg  daily, riboflavin 400mg  daily, and coenzyme Q10 100mg  three times daily 7.  Follow up in one year  Metta Clines, DO  CC: Dalbert Garnet, MD

## 2018-07-29 ENCOUNTER — Ambulatory Visit: Payer: BC Managed Care – PPO | Admitting: Neurology

## 2018-07-29 ENCOUNTER — Encounter: Payer: Self-pay | Admitting: Neurology

## 2018-07-29 VITALS — BP 104/72 | HR 82 | Ht 65.5 in | Wt 216.0 lb

## 2018-07-29 DIAGNOSIS — G43109 Migraine with aura, not intractable, without status migrainosus: Secondary | ICD-10-CM

## 2018-07-29 NOTE — Patient Instructions (Signed)
1. Continue topiramate 50mg  at bedtime 2.  Continue Excedrin and rizatriptan.   3.  Limit use of pain relievers to no more than 2 days out of week to prevent risk of rebound or medication-overuse headache. 4.  Keep headache diary 5.  Follow up in one year

## 2018-08-08 ENCOUNTER — Ambulatory Visit: Payer: BC Managed Care – PPO | Admitting: Neurology

## 2019-03-08 ENCOUNTER — Encounter (HOSPITAL_BASED_OUTPATIENT_CLINIC_OR_DEPARTMENT_OTHER): Payer: Self-pay

## 2019-03-08 ENCOUNTER — Emergency Department (HOSPITAL_BASED_OUTPATIENT_CLINIC_OR_DEPARTMENT_OTHER)
Admission: EM | Admit: 2019-03-08 | Discharge: 2019-03-08 | Disposition: A | Discharge status: Home / Self Care | Payer: BC Managed Care – PPO | Attending: Emergency Medicine | Admitting: Emergency Medicine

## 2019-03-08 ENCOUNTER — Other Ambulatory Visit: Payer: Self-pay

## 2019-03-08 DIAGNOSIS — R1012 Left upper quadrant pain: Secondary | ICD-10-CM | POA: Diagnosis not present

## 2019-03-08 DIAGNOSIS — R1033 Periumbilical pain: Secondary | ICD-10-CM | POA: Insufficient documentation

## 2019-03-08 LAB — COMPREHENSIVE METABOLIC PANEL
ALT: 14 U/L (ref 0–44)
AST: 18 U/L (ref 15–41)
Albumin: 4.1 g/dL (ref 3.5–5.0)
Alkaline Phosphatase: 41 U/L (ref 38–126)
Anion gap: 11 (ref 5–15)
BUN: 11 mg/dL (ref 6–20)
CO2: 25 mmol/L (ref 22–32)
Calcium: 9.2 mg/dL (ref 8.9–10.3)
Chloride: 100 mmol/L (ref 98–111)
Creatinine, Ser: 0.75 mg/dL (ref 0.44–1.00)
GFR calc Af Amer: >60 mL/min (ref >60)
GFR calc non Af Amer: >60 mL/min (ref >60)
Glucose, Bld: 93 mg/dL (ref 70–99)
Potassium: 3.6 mmol/L (ref 3.5–5.1)
Sodium: 136 mmol/L (ref 135–145)
Total Bilirubin: 0.9 mg/dL (ref 0.3–1.2)
Total Protein: 7.4 g/dL (ref 6.5–8.1)

## 2019-03-08 LAB — URINALYSIS, MICROSCOPIC (REFLEX): Squamous Epithelial / LPF: NONE SEEN (ref 0–5)

## 2019-03-08 LAB — CBC
HCT: 45.4 % (ref 36.0–46.0)
Hemoglobin: 16.3 g/dL — ABNORMAL HIGH (ref 12.0–15.0)
MCH: 30.3 pg (ref 26.0–34.0)
MCHC: 35.9 g/dL (ref 30.0–36.0)
MCV: 84.4 fL (ref 80.0–100.0)
Platelets: 283 10*3/uL (ref 150–400)
RBC: 5.38 MIL/uL — ABNORMAL HIGH (ref 3.87–5.11)
RDW: 12.5 % (ref 11.5–15.5)
WBC: 8.8 10*3/uL (ref 4.0–10.5)
nRBC: 0 % (ref 0.0–0.2)

## 2019-03-08 LAB — PREGNANCY, URINE: Preg Test, Ur: NEGATIVE

## 2019-03-08 LAB — URINALYSIS, ROUTINE W REFLEX MICROSCOPIC
Bilirubin Urine: NEGATIVE
Glucose, UA: NEGATIVE mg/dL
Ketones, ur: NEGATIVE mg/dL
Leukocytes,Ua: NEGATIVE
Nitrite: NEGATIVE
Protein, ur: NEGATIVE mg/dL
Specific Gravity, Urine: 1.015 (ref 1.005–1.030)
pH: 5.5 (ref 5.0–8.0)

## 2019-03-08 LAB — LIPASE, BLOOD: Lipase: 28 U/L (ref 11–51)

## 2019-03-08 MED ORDER — ALUM & MAG HYDROXIDE-SIMETH 200-200-20 MG/5ML PO SUSP
30.0000 mL | Freq: Once | ORAL | Status: AC
  Administered 2019-03-08: 30 mL via ORAL
  Filled 2019-03-08: qty 30

## 2019-03-08 MED ORDER — LIDOCAINE VISCOUS HCL 2 % MT SOLN
15.0000 mL | Freq: Once | OROMUCOSAL | Status: AC
  Administered 2019-03-08: 15 mL via ORAL
  Filled 2019-03-08: qty 15

## 2019-03-08 MED ORDER — SUCRALFATE 1 G PO TABS: 1.0000 g | ORAL_TABLET | Freq: Three times a day (TID) | ORAL | 1 refills | Status: AC

## 2019-03-08 MED ORDER — FAMOTIDINE 20 MG PO TABS: 20.0000 mg | ORAL_TABLET | Freq: Two times a day (BID) | ORAL | 0 refills | Status: DC

## 2019-03-08 MED ORDER — PANTOPRAZOLE SODIUM 20 MG PO TBEC: 20.0000 mg | DELAYED_RELEASE_TABLET | Freq: Every day | ORAL | 1 refills | Status: DC

## 2019-03-08 MED ORDER — SODIUM CHLORIDE 0.9% FLUSH
3.0000 mL | Freq: Once | INTRAVENOUS | Status: DC
  Filled 2019-03-08: qty 3

## 2019-03-08 NOTE — ED Notes (Signed)
Pt states she feels improved, medication "really helped"

## 2019-03-08 NOTE — ED Notes (Signed)
ED Provider at bedside. Rondel Oh, EDPA

## 2019-03-08 NOTE — ED Provider Notes (Signed)
[facility] Provider Note   CSN: [id number] Arrival date & time: 03/08/19  1655     History   Chief Complaint Chief Complaint  Patient presents with  . Abdominal Pain    HPI Carolyn Shaw is a 43 y.o. female.     Patient presents the emergency department with complaint of left upper quadrant and left-sided abdominal pain, worse with eating, over the past 8 days.  Patient has had a history of reflux.  No previous surgical history.  She states that she had a CT scan for similar symptoms over 10 years ago.  She has had some nausea but no vomiting.  She has had frequent bowel movements after eating that are non-watery or bloody.  No urinary symptoms.  No treatments prior to arrival.  Patient is not currently on any medications for reflux or GERD.  Pain does not radiate.  She called her primary care doctor today who referred her to the emergency department.     Past Medical History:  Diagnosis Date  . Migraines     Patient Active Problem List   Diagnosis Date Noted  . Migraine with aura and without status migrainosus, not intractable 03/14/2015  . Migraine with aura 11/16/2013    History reviewed. No pertinent surgical history.   OB History   No obstetric history on file.      Home Medications    Prior to Admission medications   Medication Sig Start Date End Date Taking? Authorizing Provider  aspirin-acetaminophen-caffeine (EXCEDRIN MIGRAINE) [phone] MG per tablet Take by mouth every 6 (six) hours as needed for headache or migraine.    [provider]  cholecalciferol (VITAMIN D) 1000 UNITS tablet Take 1,000 Units by mouth daily.    [provider]  cyclobenzaprine (FLEXERIL) 10 MG tablet Take 10 mg by mouth 3 (three) times daily as needed for muscle spasms.    [provider]  ibuprofen (ADVIL,MOTRIN) 800 MG tablet Take 800 mg by mouth every 8 (eight) hours as needed.    [provider]  Multiple  Vitamin (MULTIVITAMIN) tablet Take 1 tablet by mouth daily.    [provider]  rizatriptan (MAXALT) 10 MG tablet Take 1 tablet (10 mg total) by mouth as needed for migraine. May repeat in 2 hours if needed 08/07/17   Pieter Partridge, DO  topiramate (TOPAMAX) 50 MG tablet Take 1 tablet (50 mg total) by mouth at bedtime. 08/07/17   Pieter Partridge, DO  tranexamic acid (LYSTEDA) 650 MG TABS tablet Take 1,300 mg by mouth 3 (three) times daily.    [provider]    Family History Family History  Problem Relation Age of Onset  . Ataxia Neg Hx   . Chorea Neg Hx   . Dementia Neg Hx   . Mental retardation Neg Hx   . Migraines Neg Hx   . Multiple sclerosis Neg Hx   . Neurofibromatosis Neg Hx   . Neuropathy Neg Hx   . Parkinsonism Neg Hx   . Seizures Neg Hx   . Stroke Neg Hx     Social History Social History   Tobacco Use  . Smoking status: Never Smoker  . Smokeless tobacco: Never Used  Substance Use Topics  . Alcohol use: No    Alcohol/week: 0.0 standard drinks  . Drug use: No     Allergies   Chocolate   Review of Systems Review of Systems  Constitutional: Negative for fever.  HENT: Negative for rhinorrhea  and sore throat.   Eyes: Negative for redness.  Respiratory: Negative for cough.   Cardiovascular: Negative for chest pain.  Gastrointestinal: Positive for abdominal pain and nausea. Negative for blood in stool, diarrhea and vomiting.  Genitourinary: Negative for dysuria.  Musculoskeletal: Negative for myalgias.  Skin: Negative for rash.  Neurological: Negative for headaches.     Physical Exam Updated Vital Signs BP (!) 145/75 (BP Location: Right Arm)   Pulse 83   Temp 98.9 F (37.2 C) (Oral)   Resp 18   Ht 5\' 5"  (1.651 m)   Wt 99.8 kg   LMP 02/21/2019   SpO2 99%   BMI 36.61 kg/m   Physical Exam Vitals signs and nursing note reviewed.  Constitutional:      Appearance: She is well-developed.  HENT:     Head: Normocephalic and atraumatic.   Eyes:     General:        Right eye: No discharge.        Left eye: No discharge.     Conjunctiva/sclera: Conjunctivae normal.  Neck:     Musculoskeletal: Normal range of motion and neck supple.  Cardiovascular:     Rate and Rhythm: Normal rate and regular rhythm.     Heart sounds: Normal heart sounds.  Pulmonary:     Effort: Pulmonary effort is normal.     Breath sounds: Normal breath sounds.  Abdominal:     Palpations: Abdomen is soft.     Tenderness: There is abdominal tenderness (Mild to moderate) in the periumbilical area and left upper quadrant. There is no guarding or rebound.  Skin:    General: Skin is warm and dry.  Neurological:     Mental Status: She is alert.      ED Treatments / Results  Labs (all labs ordered are listed, but only abnormal results are displayed) Labs Reviewed  URINALYSIS, ROUTINE W REFLEX MICROSCOPIC - Abnormal; Notable for the following components:      Result Value   Hgb urine dipstick TRACE (*)    All other components within normal limits  URINALYSIS, MICROSCOPIC (REFLEX) - Abnormal; Notable for the following components:   Bacteria, UA FEW (*)    All other components within normal limits  PREGNANCY, URINE  LIPASE, BLOOD  COMPREHENSIVE METABOLIC PANEL  CBC    EKG None  Radiology No results found.  Procedures Procedures (including critical care time)  Medications Ordered in ED Medications  sodium chloride flush (NS) 0.9 % injection 3 mL (has no administration in time range)     Initial Impression / Assessment and Plan / ED Course  I have reviewed the triage vital signs and the nursing notes.  Pertinent labs & imaging results that were available during my care of the patient were reviewed by me and considered in my medical decision making (see chart for details).        Patient seen and examined. Work-up initiated.  Will treat with GI cocktail.  Anticipate discharged home with PPI, H2 blocker, Carafate, GI follow-up.   Vital signs reviewed and are as follows: BP (!) 145/75 (BP Location: Right Arm)   Pulse 83   Temp 98.9 F (37.2 C) (Oral)   Resp 18   Ht 5\' 5"  (1.651 m)   Wt 99.8 kg   LMP 02/21/2019   SpO2 99%   BMI 36.61 kg/m   8:48 PM labs are reassuring.  Patient updated on results.  She received good relief from GI cocktail.  Home treatment as  above.  She will follow-up with her PCP/GI doctor this coming week.  The patient was urged to return to the Emergency Department immediately with worsening of current symptoms, worsening abdominal pain, persistent vomiting, blood noted in stools, fever, or any other concerns. The patient verbalized understanding.    Final Clinical Impressions(s) / ED Diagnoses   Final diagnoses:  Left upper quadrant abdominal pain of unknown etiology   Patient with abdominal pain, left upper quadrant, worse with eating and food over the past week.  Symptoms are suggestive of gastritis/peptic ulcer disease.. Vitals are stable, no fever. Labs reassuring. Imaging not indicated based on history, exam, work-up. No signs of dehydration, patient is tolerating PO's. Lungs are clear and no signs suggestive of PNA. Low concern for appendicitis, cholecystitis, pancreatitis, ruptured viscus, UTI, kidney stone, aortic dissection, aortic aneurysm or other emergent abdominal etiology. Supportive therapy indicated with return if symptoms worsen.    ED Discharge Orders         Ordered    pantoprazole (PROTONIX) 20 MG tablet  Daily     03/08/19 2046    famotidine (PEPCID) 20 MG tablet  2 times daily     03/08/19 2046    sucralfate (CARAFATE) 1 g tablet  3 times daily with meals & bedtime     03/08/19 2046           Carlisle Cater, PA-C 03/08/19 2049    Lennice Sites, DO 03/08/19 2340

## 2019-03-08 NOTE — ED Notes (Signed)
ED Provider at bedside. 

## 2019-03-08 NOTE — Discharge Instructions (Signed)
Please read and follow all provided instructions.  Your diagnoses today include:  1. Left upper quadrant abdominal pain of unknown etiology     Tests performed today include:  Blood counts and electrolytes  Blood tests to check liver and kidney function  Blood tests to check pancreas function  Urine test to look for infection and pregnancy (in women)  Vital signs. See below for your results today.   Medications prescribed:   Protonix - acid blocking medication   Pepcid (famotidine) - antihistamine  You can find this medication over-the-counter.   DO NOT exceed:   20mg  Pepcid every 12 hours   Carafate - for stomach upset and to protect your stomach  Take any prescribed medications only as directed.  Home care instructions:   Follow any educational materials contained in this packet.  Follow-up instructions: Please follow-up with your primary care provider in the next 3 days for further evaluation of your symptoms.    Return instructions:  SEEK IMMEDIATE MEDICAL ATTENTION IF:  The pain does not go away or becomes severe   A temperature above 101F develops   Repeated vomiting occurs (multiple episodes)   The pain becomes localized to portions of the abdomen. The right side could possibly be appendicitis. In an adult, the left lower portion of the abdomen could be colitis or diverticulitis.   Blood is being passed in stools or vomit (bright red or black tarry stools)   You develop chest pain, difficulty breathing, dizziness or fainting, or become confused, poorly responsive, or inconsolable (young children)  If you have any other emergent concerns regarding your health  Additional Information: Abdominal (belly) pain can be caused by many things. Your caregiver performed an examination and possibly ordered blood/urine tests and imaging (CT scan, x-rays, ultrasound). Many cases can be observed and treated at home after initial evaluation in the emergency  department. Even though you are being discharged home, abdominal pain can be unpredictable. Therefore, you need a repeated exam if your pain does not resolve, returns, or worsens. Most patients with abdominal pain don't have to be admitted to the hospital or have surgery, but serious problems like appendicitis and gallbladder attacks can start out as nonspecific pain. Many abdominal conditions cannot be diagnosed in one visit, so follow-up evaluations are very important.  Your vital signs today were: BP 123/90 (BP Location: Right Arm)    Pulse 72    Temp 98.2 F (36.8 C) (Oral)    Resp 16    Ht 5\' 5"  (1.651 m)    Wt 99.8 kg    LMP 02/21/2019    SpO2 100%    BMI 36.61 kg/m  If your blood pressure (bp) was elevated above 135/85 this visit, please have this repeated by your doctor within one month. --------------

## 2019-03-08 NOTE — ED Triage Notes (Signed)
Pt reports LUQ pain with diarrhea x week. Worsening after eating. Pt has hx of GERD but reports this feels different.

## 2019-03-12 ENCOUNTER — Other Ambulatory Visit: Payer: Self-pay | Admitting: Obstetrics and Gynecology

## 2019-03-12 ENCOUNTER — Other Ambulatory Visit (HOSPITAL_COMMUNITY)
Admission: RE | Admit: 2019-03-12 | Discharge: 2019-03-12 | Disposition: A | Discharge status: Home / Self Care | Payer: BC Managed Care – PPO | Source: Ambulatory Visit | Attending: Obstetrics and Gynecology | Admitting: Obstetrics and Gynecology

## 2019-03-12 DIAGNOSIS — Z01419 Encounter for gynecological examination (general) (routine) without abnormal findings: Secondary | ICD-10-CM | POA: Diagnosis not present

## 2019-03-12 DIAGNOSIS — Z1231 Encounter for screening mammogram for malignant neoplasm of breast: Secondary | ICD-10-CM

## 2019-03-17 LAB — CYTOLOGY - PAP
Diagnosis: NEGATIVE
HPV: NOT DETECTED

## 2019-04-06 ENCOUNTER — Encounter (INDEPENDENT_AMBULATORY_CARE_PROVIDER_SITE_OTHER): Payer: Self-pay | Admitting: Bariatrics

## 2019-04-06 ENCOUNTER — Other Ambulatory Visit: Payer: Self-pay

## 2019-04-06 ENCOUNTER — Ambulatory Visit (INDEPENDENT_AMBULATORY_CARE_PROVIDER_SITE_OTHER): Payer: BC Managed Care – PPO | Admitting: Bariatrics

## 2019-04-06 VITALS — BP 99/70 | HR 64 | Temp 99.0°F | Ht 65.0 in | Wt 219.0 lb

## 2019-04-06 DIAGNOSIS — R0602 Shortness of breath: Secondary | ICD-10-CM

## 2019-04-06 DIAGNOSIS — E559 Vitamin D deficiency, unspecified: Secondary | ICD-10-CM

## 2019-04-06 DIAGNOSIS — Z1331 Encounter for screening for depression: Secondary | ICD-10-CM

## 2019-04-06 DIAGNOSIS — K219 Gastro-esophageal reflux disease without esophagitis: Secondary | ICD-10-CM

## 2019-04-06 DIAGNOSIS — Z6836 Body mass index (BMI) 36.0-36.9, adult: Secondary | ICD-10-CM

## 2019-04-06 DIAGNOSIS — R5383 Other fatigue: Secondary | ICD-10-CM | POA: Diagnosis not present

## 2019-04-06 DIAGNOSIS — Z9189 Other specified personal risk factors, not elsewhere classified: Secondary | ICD-10-CM

## 2019-04-06 DIAGNOSIS — G43109 Migraine with aura, not intractable, without status migrainosus: Secondary | ICD-10-CM

## 2019-04-06 DIAGNOSIS — Z0289 Encounter for other administrative examinations: Secondary | ICD-10-CM

## 2019-04-07 LAB — T4, FREE: Free T4: 0.94 ng/dL (ref 0.82–1.77)

## 2019-04-07 LAB — INSULIN, RANDOM: INSULIN: 8.3 u[IU]/mL (ref 2.6–24.9)

## 2019-04-07 LAB — T3: T3, Total: 127 ng/dL (ref 71–180)

## 2019-04-07 LAB — HEMOGLOBIN A1C
Est. average glucose Bld gHb Est-mCnc: 100 mg/dL
Hgb A1c MFr Bld: 5.1 % (ref 4.8–5.6)

## 2019-04-07 LAB — TSH: TSH: 5.17 u[IU]/mL — ABNORMAL HIGH (ref 0.450–4.500)

## 2019-04-07 LAB — VITAMIN D 25 HYDROXY (VIT D DEFICIENCY, FRACTURES): Vit D, 25-Hydroxy: 30.5 ng/mL (ref 30.0–100.0)

## 2019-04-07 NOTE — Progress Notes (Signed)
Office: [phone]  /  Fax: [phone]   Dear Dr. Nancy Fetter,   Thank you for referring Natane Heward to our clinic. The following note includes my evaluation and treatment recommendations.  HPI:   Chief Complaint: OBESITY    [name] has been referred by Donald Prose, MD for consultation regarding her obesity and obesity related comorbidities.    Carolyn Shaw (MR# [id number]) is a 43 y.o. female who presents on 04/06/2019 for obesity evaluation and treatment. Current BMI is Body mass index is 36.44 kg/m.Carolyn Shaw has been struggling with her weight for many years and has been unsuccessful in either losing weight, maintaining weight loss, or reaching her healthy weight goal.     Carolyn Shaw attended our information session and states she is currently in the action stage of change and ready to dedicate time achieving and maintaining a healthier weight. Carolyn Shaw is interested in becoming our patient and working on intensive lifestyle modifications including (but not limited to) diet, exercise and weight loss.    Carolyn Shaw states her desired weight loss is 39 lbs. she has been heavy most of  her life she started gaining weight in her late 20's and 30's her heaviest weight ever was 224 lbs. she likes to cook, but there are obstacles; leftovers, and she does not want to waste food she considers herself a picky eater she dislikes seafood and eggs she has significant food cravings issues  she snacks frequently in the evenings she skips meals frequently she is frequently drinking liquids with calories she frequently makes poor food choices she frequently eats larger portions than normal  she struggles with emotional eating    Fatigue Carolyn Shaw feels her energy is lower than it should be. This has worsened with weight gain and has not worsened recently. Carolyn Shaw admits to daytime somnolence and she admits to waking up still tired. Patient is at risk for obstructive sleep apnea. Patent has a history of symptoms  of daytime fatigue, morning fatigue and morning headache. Patient generally gets 6 or 7 hours of sleep per night, and states they generally have restless sleep. Snoring is not present. Apneic episodes are present. Epworth Sleepiness Score is 6  Dyspnea on exertion Lashonta notes increasing shortness of breath with certain activities and seems to be worsening over time with weight gain. She notes getting out of breath sooner with activity than she used to. This has not gotten worse recently. Tanish denies orthopnea.  Vitamin D deficiency Carolyn Shaw has a diagnosis of vitamin D deficiency. She is currently taking OTC vit D and she denies nausea, vomiting or muscle weakness.  At risk for osteopenia and osteoporosis Carolyn Shaw is at higher risk of osteopenia and osteoporosis due to vitamin D deficiency.   Migraine with aura, without status migrainosus, not intractable Carolyn Shaw has a diagnosis of migraines and she is taking Excedrin Migraine, Maxalt and Topamax (not taking).  GERD (gastroesophageal reflux disease) Carolyn Shaw has a diagnosis of gastroesophageal reflux disease and she has increased stomach acids. Carolyn Shaw is taking Pantoprazole, Sucralfate and Pepcid.   Depression Screen Carolyn Shaw's Food and Mood (modified PHQ-9) score was  Depression screen PHQ 2/9 04/06/2019  Decreased Interest 0  Down, Depressed, Hopeless 1  PHQ - 2 Score 1  Altered sleeping 0  Tired, decreased energy 1  Change in appetite 2  Feeling bad or failure about yourself  1  Trouble concentrating 1  Moving slowly or fidgety/restless 0  Suicidal thoughts 0  PHQ-9 Score 6  Difficult doing work/chores Not difficult at all  ASSESSMENT AND PLAN:  Other fatigue - Plan: EKG 12-Lead, Hemoglobin A1c, Insulin, random, T3, T4, free, TSH  Shortness of breath on exertion  Gastroesophageal reflux disease, esophagitis presence not specified  Vitamin D deficiency - Plan: VITAMIN D 25 Hydroxy (Vit-D Deficiency, Fractures)  Migraine with aura and  without status migrainosus, not intractable  Depression screening  At risk for osteoporosis  Class 2 severe obesity with serious comorbidity and body mass index (BMI) of 36.0 to 36.9 in adult, unspecified obesity type ([place])  PLAN:  Fatigue Carolyn Shaw was informed that her fatigue may be related to obesity, depression or many other causes. Labs will be ordered, and in the meanwhile Carolyn Shaw has agreed to work on diet, exercise and weight loss to help with fatigue. Proper sleep hygiene was discussed including the need for 7-8 hours of quality sleep each night. A sleep study was not ordered based on symptoms and Epworth score.  Dyspnea on exertion Carolyn Shaw's shortness of breath appears to be obesity related and exercise induced. She has agreed to work on weight loss and gradually increase exercise to treat her exercise induced shortness of breath. If [name] follows our instructions and loses weight without improvement of her shortness of breath, we will plan to refer to pulmonology. We will monitor this condition regularly. Carolyn Shaw agrees to this plan.  Vitamin D Deficiency Carolyn Shaw was informed that low vitamin D levels contributes to fatigue and are associated with obesity, breast, and colon cancer. Carolyn Shaw will continue to take OTC vitamin D and she will follow up for routine testing of vitamin D, at least 2-3 times per year. She was informed of the risk of over-replacement of vitamin D and agrees to not increase her dose unless she discusses this with [place] first.  At risk for osteopenia and osteoporosis Carolyn Shaw was given extended  (15 minutes) osteoporosis prevention counseling today. Angi is at risk for osteopenia and osteoporosis due to her vitamin D deficiency. She was encouraged to take her vitamin D and follow her higher calcium diet and increase strengthening exercise to help strengthen her bones and decrease her risk of osteopenia and osteoporosis.  Migraine with aura, without status migrainosus, not  intractable Carolyn Shaw will follow up with her PCP and neurologist and she will follow up with our clinic in 2 weeks.  GERD (gastroesophageal reflux disease) Carolyn Shaw will continue her medications. We discussed that weight loss can help with GERD. She plans to visit with her gastroenterologist in the near future.  Depression Screen Bambie had a mildly positive depression screening. Depression is commonly associated with obesity and often results in emotional eating behaviors. We will monitor this closely and work on CBT to help improve the non-hunger eating patterns. Referral to Psychology may be required if no improvement is seen as she continues in our clinic.  Obesity Zanayah is currently in the action stage of change and her goal is to continue with weight loss efforts. I recommend Keelan begin the structured treatment plan as follows:  She has agreed to follow the category 3 plan  Maythe has been instructed to eventually work up to a goal of 150 minutes of combined cardio and strengthening exercise per week for weight loss and overall health benefits. We discussed the following Behavioral Modification Strategies today: planning for success, increase H2O intake, no skipping meals, keeping healthy foods in the home, increasing lean protein intake, decreasing simple carbohydrates, increasing vegetables, decrease eating out and work on meal planning and intentional eating Handouts for additional Category 3 options  were given to patient today   She was informed of the importance of frequent follow up visits to maximize her success with intensive lifestyle modifications for her multiple health conditions. She was informed we would discuss her lab results at her next visit unless there is a critical issue that needs to be addressed sooner. Brinklee agreed to keep her next visit at the agreed upon time to discuss these results.  ALLERGIES: Allergies  Allergen Reactions   Chocolate Hives   Erythromycin Nausea  And Vomiting    MEDICATIONS: Current Outpatient Medications on File Prior to Visit  Medication Sig Dispense Refill   aspirin-acetaminophen-caffeine (EXCEDRIN MIGRAINE) [phone] MG per tablet Take by mouth every 6 (six) hours as needed for headache or migraine.     famotidine (PEPCID) 20 MG tablet Take 1 tablet (20 mg total) by mouth 2 (two) times daily. 30 tablet 0   Multiple Vitamin (MULTIVITAMIN) tablet Take 1 tablet by mouth daily.     pantoprazole (PROTONIX) 20 MG tablet Take 1 tablet (20 mg total) by mouth daily. 30 tablet 1   rizatriptan (MAXALT) 10 MG tablet Take 1 tablet (10 mg total) by mouth as needed for migraine. May repeat in 2 hours if needed 30 tablet 3   sucralfate (CARAFATE) 1 g tablet Take 1 tablet (1 g total) by mouth 4 (four) times daily -  with meals and at bedtime. 60 tablet 1   topiramate (TOPAMAX) 50 MG tablet Take 1 tablet (50 mg total) by mouth at bedtime. 90 tablet 3   cholecalciferol (VITAMIN D) 1000 UNITS tablet Take 1,000 Units by mouth daily.     cyclobenzaprine (FLEXERIL) 10 MG tablet Take 10 mg by mouth 3 (three) times daily as needed for muscle spasms.     ibuprofen (ADVIL,MOTRIN) 800 MG tablet Take 800 mg by mouth every 8 (eight) hours as needed.     tranexamic acid (LYSTEDA) 650 MG TABS tablet Take 1,300 mg by mouth 3 (three) times daily.     No current facility-administered medications on file prior to visit.     PAST MEDICAL HISTORY: Past Medical History:  Diagnosis Date   Anxiety    Back pain    Chest pain    Constipation    Depression    Fibroids    Food allergy    GERD (gastroesophageal reflux disease)    Lactose intolerance    Migraines    Seasonal allergies    Vitamin D deficiency     PAST SURGICAL HISTORY: History reviewed. No pertinent surgical history.  SOCIAL HISTORY: Social History   Tobacco Use   Smoking status: Never Smoker   Smokeless tobacco: Never Used  Substance Use Topics   Alcohol use:  No    Alcohol/week: 0.0 standard drinks   Drug use: No    FAMILY HISTORY: Family History  Problem Relation Age of Onset   Diabetes Mother    High blood pressure Mother    Thyroid disease Mother    Sleep apnea Mother    Obesity Mother    Diabetes Father    High blood pressure Father    Stroke Father    Drug abuse Father    Obesity Father    Ataxia Neg Hx    [place] Neg Hx    Dementia Neg Hx    Mental retardation Neg Hx    Migraines Neg Hx    Multiple sclerosis Neg Hx    Neurofibromatosis Neg Hx    Neuropathy Neg Hx  Parkinsonism Neg Hx    Seizures Neg Hx     ROS: Review of Systems  Constitutional: Positive for malaise/fatigue.  HENT:       Positive for Difficult or Painful Swallowing  Eyes:       Positive for Wear Glasses or Contacts  Respiratory: Positive for shortness of breath (on exertion).   Cardiovascular: Negative for orthopnea.  Gastrointestinal: Positive for constipation and heartburn. Negative for nausea and vomiting.  Musculoskeletal:       Positive for Muscle or Joint Pain (knee)  Skin: Positive for itching.       Positive for Hair or Nail Changes  Neurological: Positive for headaches.  Psychiatric/Behavioral: The patient has insomnia.     PHYSICAL EXAM: Blood pressure 99/70, pulse 64, temperature 99 F (37.2 C), temperature source Oral, height 5\' 5"  (1.651 m), weight 219 lb (99.3 kg), last menstrual period 03/14/2019, SpO2 96 %. Body mass index is 36.44 kg/m. Physical Exam Vitals signs reviewed.  Constitutional:      Appearance: Normal appearance. She is well-developed. She is obese.  HENT:     Head: Normocephalic and atraumatic.     Nose: Nose normal.  Eyes:     General: No scleral icterus.    Extraocular Movements: Extraocular movements intact.  Neck:     Musculoskeletal: Normal range of motion and neck supple.     Thyroid: No thyromegaly.  Cardiovascular:     Rate and Rhythm: Normal rate and regular rhythm.    Pulmonary:     Effort: Pulmonary effort is normal. No respiratory distress.  Abdominal:     Palpations: Abdomen is soft.     Tenderness: There is no abdominal tenderness.  Musculoskeletal: Normal range of motion.     Comments: Range of Motion normal in all 4 extremities  Skin:    General: Skin is warm and dry.  Neurological:     Mental Status: She is alert and oriented to person, place, and time.     Coordination: Coordination normal.  Psychiatric:        Mood and Affect: Mood normal.        Behavior: Behavior normal.     RECENT LABS AND TESTS: BMET    Component Value Date/Time   NA 136 03/08/2019 1721   K 3.6 03/08/2019 1721   CL 100 03/08/2019 1721   CO2 25 03/08/2019 1721   GLUCOSE 93 03/08/2019 1721   BUN 11 03/08/2019 1721   CREATININE 0.75 03/08/2019 1721   CALCIUM 9.2 03/08/2019 1721   GFRNONAA >60 03/08/2019 1721   GFRAA >60 03/08/2019 1721   Lab Results  Component Value Date   HGBA1C 5.1 04/06/2019   Lab Results  Component Value Date   INSULIN 8.3 04/06/2019   CBC    Component Value Date/Time   WBC 8.8 03/08/2019 1721   RBC 5.38 (H) 03/08/2019 1721   HGB 16.3 (H) 03/08/2019 1721   HCT 45.4 03/08/2019 1721   PLT 283 03/08/2019 1721   MCV 84.4 03/08/2019 1721   MCH 30.3 03/08/2019 1721   MCHC 35.9 03/08/2019 1721   RDW 12.5 03/08/2019 1721   Iron/TIBC/Ferritin/ %Sat No results found for: IRON, TIBC, FERRITIN, IRONPCTSAT Lipid Panel  No results found for: CHOL, TRIG, HDL, CHOLHDL, VLDL, LDLCALC, LDLDIRECT Hepatic Function Panel     Component Value Date/Time   PROT 7.4 03/08/2019 1721   ALBUMIN 4.1 03/08/2019 1721   AST 18 03/08/2019 1721   ALT 14 03/08/2019 1721   ALKPHOS 41 03/08/2019 1721  BILITOT 0.9 03/08/2019 1721      Component Value Date/Time   TSH 5.170 (H) 04/06/2019 1013    ECG  shows NSR with a rate of 75 BPM INDIRECT CALORIMETER done today shows a VO2 of 288 and a REE of 2006.  Her calculated basal metabolic rate is 6945  thus her basal metabolic rate is better than expected.      OBESITY BEHAVIORAL INTERVENTION VISIT  Today's visit was # 1   Starting weight: 219 lbs Starting date: 04/06/2019 Today's weight : 219 lbs Today's date: 04/06/2019 Total lbs lost to date: 0    04/06/2019  Height 5\' 5"  (1.651 m)  Weight 219 lb (99.3 kg)  BMI (Calculated) 36.44  BLOOD PRESSURE - SYSTOLIC 99  BLOOD PRESSURE - DIASTOLIC 70  Waist Measurement  43 inches   Body Fat % 47.3 %  Total Body Water (lbs) 84.6 lbs  RMR 2006    ASK: We discussed the diagnosis of obesity with Arlis Porta today and Iraida agreed to give [place] permission to discuss obesity behavioral modification therapy today.  ASSESS: Leora has the diagnosis of obesity and her BMI today is 36.44 Pearlina is in the action stage of change   ADVISE: Demetris was educated on the multiple health risks of obesity as well as the benefit of weight loss to improve her health. She was advised of the need for long term treatment and the importance of lifestyle modifications to improve her current health and to decrease her risk of future health problems.  AGREE: Multiple dietary modification options and treatment options were discussed and  Blessings agreed to follow the recommendations documented in the above note.  ARRANGE: Rhona was educated on the importance of frequent visits to treat obesity as outlined per CMS and USPSTF guidelines and agreed to schedule her next follow up appointment today.  Corey Skains, am acting as Location manager for [employer]. Owens Shark, DO  I have reviewed the above documentation for accuracy and completeness, and I agree with the above. -Jearld Lesch, DO
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
## 2019-04-08 ENCOUNTER — Other Ambulatory Visit: Payer: Self-pay | Admitting: Gastroenterology

## 2019-04-08 ENCOUNTER — Ambulatory Visit (INDEPENDENT_AMBULATORY_CARE_PROVIDER_SITE_OTHER): Payer: BC Managed Care – PPO | Admitting: Psychology

## 2019-04-08 ENCOUNTER — Other Ambulatory Visit: Payer: Self-pay

## 2019-04-08 DIAGNOSIS — F418 Other specified anxiety disorders: Secondary | ICD-10-CM | POA: Diagnosis not present

## 2019-04-08 DIAGNOSIS — R1013 Epigastric pain: Secondary | ICD-10-CM

## 2019-04-20 ENCOUNTER — Other Ambulatory Visit: Payer: Self-pay

## 2019-04-20 ENCOUNTER — Encounter (INDEPENDENT_AMBULATORY_CARE_PROVIDER_SITE_OTHER): Payer: Self-pay | Admitting: Bariatrics

## 2019-04-20 ENCOUNTER — Ambulatory Visit (INDEPENDENT_AMBULATORY_CARE_PROVIDER_SITE_OTHER): Payer: BC Managed Care – PPO | Admitting: Bariatrics

## 2019-04-20 VITALS — BP 115/78 | HR 77 | Temp 98.4°F | Ht 65.0 in | Wt 218.0 lb

## 2019-04-20 DIAGNOSIS — R7989 Other specified abnormal findings of blood chemistry: Secondary | ICD-10-CM

## 2019-04-20 DIAGNOSIS — Z9189 Other specified personal risk factors, not elsewhere classified: Secondary | ICD-10-CM

## 2019-04-20 DIAGNOSIS — F3289 Other specified depressive episodes: Secondary | ICD-10-CM

## 2019-04-20 DIAGNOSIS — E559 Vitamin D deficiency, unspecified: Secondary | ICD-10-CM

## 2019-04-20 DIAGNOSIS — K5909 Other constipation: Secondary | ICD-10-CM

## 2019-04-20 DIAGNOSIS — Z6836 Body mass index (BMI) 36.0-36.9, adult: Secondary | ICD-10-CM

## 2019-04-20 MED ORDER — VITAMIN D (ERGOCALCIFEROL) 1.25 MG (50000 UNIT) PO CAPS: 50000.0000 [IU] | ORAL_CAPSULE | ORAL | 0 refills | Status: DC

## 2019-04-22 NOTE — Progress Notes (Signed)
Office: [phone]  /  Fax: [phone]   HPI:   Chief Complaint: OBESITY Carolyn Shaw is here to discuss her progress with her obesity treatment plan. She is on the Category 3 plan and is following her eating plan approximately 65 to 70 % of the time. She states she is exercising 0 minutes 0 times per week. Carolyn Shaw is down 1 pound, but she expected more. She states that she had pizza on Friday. Carolyn Shaw has skipped meals. Carolyn Shaw has been eating out less. Her weight is 218 lb (98.9 kg) today and has had a weight loss of 1 pound over a period of 2 weeks since her last visit. She has lost 1 lb since starting treatment with [place].  Vitamin D deficiency Carolyn Shaw has a diagnosis of vitamin D deficiency. Her last vitamin D level was at 30.5 She is currently taking vit D and she denies nausea, vomiting or muscle weakness.  At risk for osteopenia and osteoporosis Carolyn Shaw is at higher risk of osteopenia and osteoporosis due to vitamin D deficiency.   Depression with emotional eating behaviors Carolyn Shaw is struggling with emotional eating and using food for comfort to the extent that it is negatively impacting her health. She often snacks when she is not hungry. Carolyn Shaw sometimes feels she is out of control and then feels guilty that she made poor food choices. Carolyn Shaw has met with Dr. Mallie Mussel (bariatric psychologist). She has been working on behavior modification techniques to help reduce her emotional eating and has been somewhat successful. She shows no sign of suicidal or homicidal ideations.  Elevated TSH (slightly) Carolyn Shaw has a slightly elevated TSH of 5.170 She is not on medications. She denies hot or cold intolerance or fatigue.  Constipation Carolyn Shaw notes constipation for the last few weeks, worse since attempting weight loss. She states BM are less frequent and are not hard and painful. She is using metamucil cookies.  ASSESSMENT AND PLAN:  Vitamin D deficiency - Plan: Vitamin D, Ergocalciferol, (DRISDOL) 1.25 MG  (50000 UT) CAPS capsule  Elevated TSH  Other constipation  Other depression - with emotional eating   At risk for osteoporosis  Class 2 severe obesity with serious comorbidity and body mass index (BMI) of 36.0 to 36.9 in adult, unspecified obesity type ([place])  PLAN:  Vitamin D Deficiency Carolyn Shaw was informed that low vitamin D levels contributes to fatigue and are associated with obesity, breast, and colon cancer. Carolyn Shaw agrees to take prescription Vit D _0 ,000 IU every week #4 with no refills and she will follow up for routine testing of vitamin D, at least 2-3 times per year. She was informed of the risk of over-replacement of vitamin D and agrees to not increase her dose unless she discusses this with [place] first. Tally agrees to follow up with our clinic in 3 to 4 weeks.  At risk for osteopenia and osteoporosis Carolyn Shaw was given extended  (15 minutes) osteoporosis prevention counseling today. Carolyn Shaw is at risk for osteopenia and osteoporosis due to her vitamin D deficiency. She was encouraged to take her vitamin D and follow her higher calcium diet and increase strengthening exercise to help strengthen her bones and decrease her risk of osteopenia and osteoporosis.  Depression with Emotional Eating Behaviors We discussed behavior modification techniques today to help Carolyn Shaw deal with her emotional eating and depression. Carolyn Shaw will follow up as directed.  Elevated TSH (slightly) Carolyn Shaw was informed of the importance of good thyroid control to help with weight loss efforts. She was also informed  that supertherapeutic thyroid levels are dangerous and will not improve weight loss results. We will recheck labs in 6 to 8 weeks.  Constipation Carolyn Shaw was informed decrease bowel movement frequency is normal while losing weight, but stools should not be hard or painful. She was advised to increase her H20 intake and work on increasing her fiber intake. She will begin taking benefiber as needed and continue  taking metamucil cookies. Carolyn Shaw will follow up as directed.  Obesity Carolyn Shaw is currently in the action stage of change. As such, her goal is to continue with weight loss efforts She has agreed to follow the Category 3 plan Carolyn Shaw will increase exercise for weight loss and overall health benefits. We discussed the following Behavioral Modification Strategies today: planning for success, increase H2O intake to 64 ounces, no skipping meals, keeping healthy foods in the home, increasing lean protein intake and have protein at work, decreasing simple carbohydrates, increasing vegetables, decrease eating out and work on meal planning and easy cooking plans  Carolyn Shaw has agreed to follow up with our clinic in 3 to 4 weeks. She was informed of the importance of frequent follow up visits to maximize her success with intensive lifestyle modifications for her multiple health conditions.  ALLERGIES: Allergies  Allergen Reactions   Chocolate Hives   Erythromycin Nausea And Vomiting    MEDICATIONS: Current Outpatient Medications on File Prior to Visit  Medication Sig Dispense Refill   aspirin-acetaminophen-caffeine (EXCEDRIN MIGRAINE) [phone] MG per tablet Take by mouth every 6 (six) hours as needed for headache or migraine.     cyclobenzaprine (FLEXERIL) 10 MG tablet Take 10 mg by mouth 3 (three) times daily as needed for muscle spasms.     famotidine (PEPCID) 20 MG tablet Take 1 tablet (20 mg total) by mouth 2 (two) times daily. 30 tablet 0   ibuprofen (ADVIL,MOTRIN) 800 MG tablet Take 800 mg by mouth every 8 (eight) hours as needed.     Multiple Vitamin (MULTIVITAMIN) tablet Take 1 tablet by mouth daily.     rizatriptan (MAXALT) 10 MG tablet Take 1 tablet (10 mg total) by mouth as needed for migraine. May repeat in 2 hours if needed 30 tablet 3   sucralfate (CARAFATE) 1 g tablet Take 1 tablet (1 g total) by mouth 4 (four) times daily -  with meals and at bedtime. 60 tablet 1   topiramate  (TOPAMAX) 50 MG tablet Take 1 tablet (50 mg total) by mouth at bedtime. 90 tablet 3   tranexamic acid (LYSTEDA) 650 MG TABS tablet Take 1,300 mg by mouth 3 (three) times daily.     No current facility-administered medications on file prior to visit.     PAST MEDICAL HISTORY: Past Medical History:  Diagnosis Date   Anxiety    Back pain    Chest pain    Constipation    Depression    Fibroids    Food allergy    GERD (gastroesophageal reflux disease)    Lactose intolerance    Migraines    Seasonal allergies    Vitamin D deficiency     PAST SURGICAL HISTORY: History reviewed. No pertinent surgical history.  SOCIAL HISTORY: Social History   Tobacco Use   Smoking status: Never Smoker   Smokeless tobacco: Never Used  Substance Use Topics   Alcohol use: No    Alcohol/week: 0.0 standard drinks   Drug use: No    FAMILY HISTORY: Family History  Problem Relation Age of Onset   Diabetes Mother  High blood pressure Mother    Thyroid disease Mother    Sleep apnea Mother    Obesity Mother    Diabetes Father    High blood pressure Father    Stroke Father    Drug abuse Father    Obesity Father    Ataxia Neg Hx    [place] Neg Hx    Dementia Neg Hx    Mental retardation Neg Hx    Migraines Neg Hx    Multiple sclerosis Neg Hx    Neurofibromatosis Neg Hx    Neuropathy Neg Hx    Parkinsonism Neg Hx    Seizures Neg Hx     ROS: Review of Systems  Constitutional: Positive for weight loss. Negative for malaise/fatigue.  Gastrointestinal: Positive for constipation. Negative for nausea and vomiting.  Musculoskeletal:       Negative for muscle weakness  Endo/Heme/Allergies:       Negative for hot or cold intolerance  Psychiatric/Behavioral: Positive for depression. Negative for suicidal ideas.    PHYSICAL EXAM: Blood pressure 115/78, pulse 77, temperature 98.4 F (36.9 C), temperature source Oral, height '5\' 5"'$  (1.651 m), weight 218 lb  (98.9 kg), last menstrual period 04/07/2019, SpO2 98 %. Body mass index is 36.28 kg/m. Physical Exam Vitals signs reviewed.  Constitutional:      Appearance: Normal appearance. She is well-developed. She is obese.  Cardiovascular:     Rate and Rhythm: Normal rate.  Pulmonary:     Effort: Pulmonary effort is normal.  Musculoskeletal: Normal range of motion.  Skin:    General: Skin is warm and dry.  Neurological:     Mental Status: She is alert and oriented to person, place, and time.  Psychiatric:        Mood and Affect: Mood normal.        Behavior: Behavior normal.        Thought Content: Thought content does not include homicidal or suicidal ideation.     RECENT LABS AND TESTS: BMET    Component Value Date/Time   NA 136 03/08/2019 1721   K 3.6 03/08/2019 1721   CL 100 03/08/2019 1721   CO2 25 03/08/2019 1721   GLUCOSE 93 03/08/2019 1721   BUN 11 03/08/2019 1721   CREATININE 0.75 03/08/2019 1721   CALCIUM 9.2 03/08/2019 1721   GFRNONAA >60 03/08/2019 1721   GFRAA >60 03/08/2019 1721   Lab Results  Component Value Date   HGBA1C 5.1 04/06/2019   Lab Results  Component Value Date   INSULIN 8.3 04/06/2019   CBC    Component Value Date/Time   WBC 8.8 03/08/2019 1721   RBC 5.38 (H) 03/08/2019 1721   HGB 16.3 (H) 03/08/2019 1721   HCT 45.4 03/08/2019 1721   PLT 283 03/08/2019 1721   MCV 84.4 03/08/2019 1721   MCH 30.3 03/08/2019 1721   MCHC 35.9 03/08/2019 1721   RDW 12.5 03/08/2019 1721   Iron/TIBC/Ferritin/ %Sat No results found for: IRON, TIBC, FERRITIN, IRONPCTSAT Lipid Panel  No results found for: CHOL, TRIG, HDL, CHOLHDL, VLDL, LDLCALC, LDLDIRECT Hepatic Function Panel     Component Value Date/Time   PROT 7.4 03/08/2019 1721   ALBUMIN 4.1 03/08/2019 1721   AST 18 03/08/2019 1721   ALT 14 03/08/2019 1721   ALKPHOS 41 03/08/2019 1721   BILITOT 0.9 03/08/2019 1721      Component Value Date/Time   TSH 5.170 (H) 04/06/2019 1013     Ref. Range  04/06/2019 10:13  Vitamin D,  25-Hydroxy Latest Ref Range: 30.0 - 100.0 ng/mL 30.5    OBESITY BEHAVIORAL INTERVENTION VISIT  Today's visit was # 2   Starting weight: 219 lbs Starting date: 04/06/2019 Today's weight : 218 lbs Today's date: 04/20/2019 Total lbs lost to date: 1    04/20/2019  Height _0  (1.651 m)  Weight 218 lb (98.9 kg)  BMI (Calculated) 36.28  BLOOD PRESSURE - SYSTOLIC 500  BLOOD PRESSURE - DIASTOLIC 78   Body Fat % 16.4 %  Total Body Water (lbs) 87.2 lbs    ASK: We discussed the diagnosis of obesity with Carolyn Shaw today and Carolyn Shaw agreed to give [place] permission to discuss obesity behavioral modification therapy today.  ASSESS: Jazzmin has the diagnosis of obesity and her BMI today is 36.28 Carolyn Shaw is in the action stage of change   ADVISE: Jakhiya was educated on the multiple health risks of obesity as well as the benefit of weight loss to improve her health. She was advised of the need for long term treatment and the importance of lifestyle modifications to improve her current health and to decrease her risk of future health problems.  AGREE: Multiple dietary modification options and treatment options were discussed and  Sherese agreed to follow the recommendations documented in the above note.  ARRANGE: Natasa was educated on the importance of frequent visits to treat obesity as outlined per CMS and USPSTF guidelines and agreed to schedule her next follow up appointment today.  Corey Skains, am acting as Location manager for [employer]. Owens Shark, DO  I have reviewed the above documentation for accuracy and completeness, and I agree with the above. -Jearld Lesch, DO

## 2019-04-22 NOTE — Progress Notes (Signed)
Office: [phone]  /  Fax: [phone]    Date: April 30, 2019   Appointment Start Time: 3:55pm Duration: 37 minutes Provider: Glennie Isle, Psy.D. Type of Session: Individual Therapy  Location of Patient: Home Location of Provider: [facility] Type of Contact: Telepsychological Visit via [employer] Web[employer]   Session Content: Carolyn Shaw is a 43 y.o. female presenting via [place] for a follow-up appointment to address the previously established treatment goal of decreasing emotional eating. Today's appointment was a telepsychological visit, as this provider's clinic is seeing a limited number of patients for in-person visits due to COVID-19. Therapeutic services will resume to in-person appointments once deemed appropriate. [name] expressed understanding regarding the rationale for telepsychological services, and provided verbal consent for today's appointment. Prior to proceeding with today's appointment, [name]'s physical location at the time of this appointment was obtained. [name] reported she was at home and provided the address. In the event of technical difficulties, [name] shared a phone number she could be reached at. [name] and this provider participated in today's telepsychological service. Also, [name] denied anyone else being present in the room or on the WebEx appointment.  This provider conducted a brief check-in and verbally administered the PHQ-9 and GAD-7. Carolyn Shaw stated she was informed 2 weeks ago that she had an opportunity to switch roles at work. Initially, she was offered a choice, but she was later informed that she did not have a choice. Nevertheless, she was later informed that she did not have to take the alternate position. She described the aforementioned as impacting her mood. In addition, Carolyn Shaw stated a friend's husband had a "massive heart attack" and he passed away. [name] further shared she is scheduled for an endoscopy on September 22nd.  Regarding  eating, Carolyn Shaw stated she reviewed the handout that was shared by this provider. She acknowledged episodes of emotional eating since the last appointment with this provider, but noted a reduction. Additionally, Carolyn Shaw stated she skipped meals and noted that she discussed skipping meals with [name] during their last appointment. Psychoeducation regarding triggers for emotional eating was provided. Carolyn Shaw was provided a handout, and encouraged to utilize the handout between now and the next appointment to increase awareness of triggers and frequency. Carolyn Shaw agreed. This provider also discussed behavioral strategies for specific triggers, such as placing the utensil down when conversing to avoid mindless eating. [name] provided verbal consent during today's appointment for this provider to send the handout about triggers via e-mail. Carolyn Shaw was receptive to today's session as evidenced by openness to sharing, responsiveness to feedback, and willingness to explore triggers for emotional eating.  Mental Status Examination:  Appearance: neat Behavior: cooperative Mood: euthymic Affect: mood congruent Speech: normal in rate, volume, and tone Eye Contact: appropriate Psychomotor Activity: appropriate Thought Process: linear, logical, and goal directed  Content/Perceptual Disturbances: no hallucinations, delusions, bizarre thinking or behavior reported or observed and no evidence of suicidal and homicidal ideation, plan, and intent Orientation: time, person, place and purpose of appointment Cognition/Sensorium: memory, attention, language, and fund of knowledge intact  Insight: good Judgment: good  Structured Assessment Results: The Patient Health Questionnaire-9 (PHQ-9) is a self-report measure that assesses symptoms and severity of depression over the course of the last two weeks. Carolyn Shaw obtained a score of 2 suggesting minimal depression. Carolyn Shaw finds the endorsed symptoms to be not difficult at all. Little  interest or pleasure in doing things 0  Feeling down, depressed, or hopeless 1  Trouble falling or staying asleep, or sleeping  too much 0  Feeling tired or having little energy 0  Poor appetite or overeating 0  Feeling bad about yourself --- or that you are a failure or have let yourself or your family down 0  Trouble concentrating on things, such as reading the newspaper or watching television 1  Moving or speaking so slowly that other people could have noticed? Or the opposite --- being so fidgety or restless that you have been moving around a lot more than usual 0  Thoughts that you would be better off dead or hurting yourself in some way 0  PHQ-9 Score 2    The Generalized Anxiety Disorder-7 (GAD-7) is a brief self-report measure that assesses symptoms of anxiety over the course of the last two weeks. Carolyn Shaw obtained a score of 1 suggesting minimal anxiety. Carolyn Shaw finds the endorsed symptoms to be not difficult at all. Feeling nervous, anxious, on edge 0  Not being able to stop or control worrying 0  Worrying too much about different things 0  Trouble relaxing 0  Being so restless that it's hard to sit still 0  Becoming easily annoyed or irritable 1  Feeling afraid as if something awful might happen 0  GAD-7 Score 1   Interventions:  Conducted a brief chart review Verbal administration of PHQ-9 and GAD-7 for symptom monitoring Provided empathic reflections and validation Reviewed content from the previous session Psychoeducation provided regarding triggers for emotional eating Focused on rapport building Employed supportive psychotherapy interventions to facilitate reduced distress, and to improve coping skills with identified stressors  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  Progress is limited, as Carolyn Shaw has just begun treatment with this provider; however, she is receptive to the interaction and interventions and rapport is being established.   Plan: Meri continues to appear able and willing to participate as evidenced by engagement in reciprocal conversation, and asking questions for clarification as appropriate. Based on appointment availability and Anyla's schedule, the next appointment will be scheduled in 3-4 weeks, which will be via [employer]. She acknowledged understanding that she may call this provider's office and request an appointment sooner if available. The next session will focus on the introduction of mindfulness.

## 2019-04-23 ENCOUNTER — Encounter (INDEPENDENT_AMBULATORY_CARE_PROVIDER_SITE_OTHER): Payer: Self-pay | Admitting: Bariatrics

## 2019-04-24 ENCOUNTER — Ambulatory Visit
Admission: RE | Admit: 2019-04-24 | Discharge: 2019-04-24 | Disposition: A | Discharge status: Home / Self Care | Payer: BC Managed Care – PPO | Source: Ambulatory Visit | Attending: Obstetrics and Gynecology | Admitting: Obstetrics and Gynecology

## 2019-04-24 ENCOUNTER — Ambulatory Visit
Admission: RE | Admit: 2019-04-24 | Discharge: 2019-04-24 | Disposition: A | Discharge status: Home / Self Care | Payer: BC Managed Care – PPO | Source: Ambulatory Visit | Attending: Gastroenterology | Admitting: Gastroenterology

## 2019-04-24 ENCOUNTER — Other Ambulatory Visit: Payer: Self-pay

## 2019-04-24 DIAGNOSIS — R1013 Epigastric pain: Secondary | ICD-10-CM

## 2019-04-24 DIAGNOSIS — Z1231 Encounter for screening mammogram for malignant neoplasm of breast: Secondary | ICD-10-CM

## 2019-04-28 ENCOUNTER — Other Ambulatory Visit: Payer: Self-pay | Admitting: Obstetrics and Gynecology

## 2019-04-28 DIAGNOSIS — R928 Other abnormal and inconclusive findings on diagnostic imaging of breast: Secondary | ICD-10-CM

## 2019-04-30 ENCOUNTER — Ambulatory Visit (INDEPENDENT_AMBULATORY_CARE_PROVIDER_SITE_OTHER): Payer: BC Managed Care – PPO | Admitting: Psychology

## 2019-04-30 ENCOUNTER — Other Ambulatory Visit: Payer: Self-pay

## 2019-04-30 DIAGNOSIS — F418 Other specified anxiety disorders: Secondary | ICD-10-CM | POA: Diagnosis not present

## 2019-05-05 ENCOUNTER — Ambulatory Visit
Admission: RE | Admit: 2019-05-05 | Discharge: 2019-05-05 | Disposition: A | Discharge status: Home / Self Care | Payer: BC Managed Care – PPO | Source: Ambulatory Visit | Attending: Obstetrics and Gynecology | Admitting: Obstetrics and Gynecology

## 2019-05-05 ENCOUNTER — Other Ambulatory Visit: Payer: Self-pay

## 2019-05-05 DIAGNOSIS — R928 Other abnormal and inconclusive findings on diagnostic imaging of breast: Secondary | ICD-10-CM

## 2019-05-11 ENCOUNTER — Ambulatory Visit (INDEPENDENT_AMBULATORY_CARE_PROVIDER_SITE_OTHER): Payer: BC Managed Care – PPO | Admitting: Bariatrics

## 2019-05-11 ENCOUNTER — Other Ambulatory Visit: Payer: Self-pay

## 2019-05-11 VITALS — BP 134/85 | HR 71 | Temp 98.4°F | Ht 65.0 in | Wt 215.4 lb

## 2019-05-11 DIAGNOSIS — Z9189 Other specified personal risk factors, not elsewhere classified: Secondary | ICD-10-CM

## 2019-05-11 DIAGNOSIS — E559 Vitamin D deficiency, unspecified: Secondary | ICD-10-CM

## 2019-05-11 DIAGNOSIS — F3289 Other specified depressive episodes: Secondary | ICD-10-CM

## 2019-05-11 DIAGNOSIS — Z6835 Body mass index (BMI) 35.0-35.9, adult: Secondary | ICD-10-CM

## 2019-05-11 MED ORDER — VITAMIN D (ERGOCALCIFEROL) 1.25 MG (50000 UNIT) PO CAPS: 50000.0000 [IU] | ORAL_CAPSULE | ORAL | 0 refills | Status: DC

## 2019-05-11 NOTE — Progress Notes (Signed)
Office: [phone]  /  Fax: [phone]   HPI:   Chief Complaint: OBESITY Carolyn Shaw is here to discuss her progress with her obesity treatment plan. She is on the Category 3 plan and is following her eating plan approximately 55 % of the time. She states she is exercising 0 minutes 0 times per week. Carolyn Shaw is down 3 lbs. She has been skipping meals. She has gotten better with her dinner.  Her weight is 215 lb 6.4 oz (97.7 kg) today and has had a weight loss of 3 pounds over a period of 3 weeks since her last visit. She has lost 4 lbs since starting treatment with [place].  Vitamin D Deficiency Carolyn Shaw has a diagnosis of vitamin D deficiency. She is currently taking prescription Vit D and denies nausea, vomiting or muscle weakness.  At risk for osteopenia and osteoporosis Carolyn Shaw is at higher risk of osteopenia and osteoporosis due to vitamin D deficiency.   Depression with Emotional Eating Behaviors Carolyn Shaw is struggling with emotional eating and using food for comfort to the extent that it is negatively impacting her health. She often snacks when she is not hungry. Carolyn Shaw sometimes feels she is out of control and then feels guilty that she made poor food choices. She has been working on behavior modification techniques to help reduce her emotional eating and has been somewhat successful. She is seeing Dr. Mallie Mussel and she shows no sign of suicidal or homicidal ideations.  Depression screen [facility] 2/9 04/06/2019  Decreased Interest 0  Down, Depressed, Hopeless 1  PHQ - 2 Score 1  Altered sleeping 0  Tired, decreased energy 1  Change in appetite 2  Feeling bad or failure about yourself  1  Trouble concentrating 1  Moving slowly or fidgety/restless 0  Suicidal thoughts 0  PHQ-9 Score 6  Difficult doing work/chores Not difficult at all    ASSESSMENT AND PLAN:  Vitamin D deficiency - Plan: Vitamin D, Ergocalciferol, (DRISDOL) 1.25 MG (50000 UT) CAPS capsule  Other depression  At risk for  osteoporosis  Class 2 severe obesity with serious comorbidity and body mass index (BMI) of 35.0 to 35.9 in adult, unspecified obesity type (HCC)  PLAN:  Vitamin D Deficiency Carolyn Shaw was informed that low vitamin D levels contributes to fatigue and are associated with obesity, breast, and colon cancer. Carolyn Shaw agrees to continue taking prescription Vit D 50,000 IU every week #4 and we will refill for 1 month. She will follow up for routine testing of vitamin D, at least 2-3 times per year. She was informed of the risk of over-replacement of vitamin D and agrees to not increase her dose unless she discusses this with [place] first. Carolyn Shaw agrees to follow up with our clinic in 2 weeks.  At risk for osteopenia and osteoporosis Carolyn Shaw was given extended (15 minutes) osteoporosis prevention counseling today. Carolyn Shaw is at risk for osteopenia and osteoporsis due to her vitamin D deficiency. She was encouraged to take her vitamin D and follow her higher calcium diet and increase strengthening exercise to help strengthen her bones and decrease her risk of osteopenia and osteoporosis.  Depression with Emotional Eating Behaviors We discussed behavior modification techniques today to help Carolyn Shaw deal with her emotional eating and depression. Carolyn Shaw is to continue seeing Dr. Mallie Mussel, and she agrees to follow up with our clinic in 2 weeks.  Obesity Carolyn Shaw is currently in the action stage of change. As such, her goal is to continue with weight loss efforts She has agreed  to follow the Category 3 plan Carolyn Shaw has been instructed to work up to a goal of 150 minutes of combined cardio and strengthening exercise per week for weight loss and overall health benefits. We discussed the following Behavioral Modification Strategies today: increasing lean protein intake, decreasing simple carbohydrates, increasing vegetables, decrease eating out, increase H20 intake, no skipping meals, work on meal planning and easy cooking plans, keeping  healthy foods in the home, ways to avoid boredom eating, and better snacking choices Carolyn Shaw will wean herself to not put sugar in her teas. She is to increase water and keep a bottle of water with her.  Carolyn Shaw has agreed to follow up with our clinic in 2 weeks. She was informed of the importance of frequent follow up visits to maximize her success with intensive lifestyle modifications for her multiple health conditions.  ALLERGIES: Allergies  Allergen Reactions  . Chocolate Hives  . Erythromycin Nausea And Vomiting    MEDICATIONS: Current Outpatient Medications on File Prior to Visit  Medication Sig Dispense Refill  . aspirin-acetaminophen-caffeine (EXCEDRIN MIGRAINE) 250-250-65 MG per tablet Take by mouth every 6 (six) hours as needed for headache or migraine.    . cyclobenzaprine (FLEXERIL) 10 MG tablet Take 10 mg by mouth 3 (three) times daily as needed for muscle spasms.    . famotidine (PEPCID) 20 MG tablet Take 1 tablet (20 mg total) by mouth 2 (two) times daily. 30 tablet 0  . ibuprofen (ADVIL,MOTRIN) 800 MG tablet Take 800 mg by mouth every 8 (eight) hours as needed.    . Multiple Vitamin (MULTIVITAMIN) tablet Take 1 tablet by mouth daily.    . rizatriptan (MAXALT) 10 MG tablet Take 1 tablet (10 mg total) by mouth as needed for migraine. May repeat in 2 hours if needed 30 tablet 3  . sucralfate (CARAFATE) 1 g tablet Take 1 tablet (1 g total) by mouth 4 (four) times daily -  with meals and at bedtime. 60 tablet 1  . topiramate (TOPAMAX) 50 MG tablet Take 1 tablet (50 mg total) by mouth at bedtime. 90 tablet 3  . tranexamic acid (LYSTEDA) 650 MG TABS tablet Take 1,300 mg by mouth 3 (three) times daily.     No current facility-administered medications on file prior to visit.     PAST MEDICAL HISTORY: Past Medical History:  Diagnosis Date  . Anxiety   . Back pain   . Chest pain   . Constipation   . Depression   . Fibroids   . Food allergy   . GERD (gastroesophageal reflux  disease)   . Lactose intolerance   . Migraines   . Seasonal allergies   . Vitamin D deficiency     PAST SURGICAL HISTORY: No past surgical history on file.  SOCIAL HISTORY: Social History   Tobacco Use  . Smoking status: Never Smoker  . Smokeless tobacco: Never Used  Substance Use Topics  . Alcohol use: No    Alcohol/week: 0.0 standard drinks  . Drug use: No    FAMILY HISTORY: Family History  Problem Relation Age of Onset  . Diabetes Mother   . High blood pressure Mother   . Thyroid disease Mother   . Sleep apnea Mother   . Obesity Mother   . Diabetes Father   . High blood pressure Father   . Stroke Father   . Drug abuse Father   . Obesity Father   . Ataxia Neg Hx   . [place] Neg Hx   .  Dementia Neg Hx   . Mental retardation Neg Hx   . Migraines Neg Hx   . Multiple sclerosis Neg Hx   . Neurofibromatosis Neg Hx   . Neuropathy Neg Hx   . Parkinsonism Neg Hx   . Seizures Neg Hx     ROS: Review of Systems  Constitutional: Positive for weight loss.  Gastrointestinal: Negative for nausea and vomiting.  Musculoskeletal:       Negative muscle weakness  Psychiatric/Behavioral: Positive for depression. Negative for suicidal ideas.    PHYSICAL EXAM: Pulse 71, temperature 98.4 F (36.9 C), temperature source Oral, height 5\' 5"  (1.651 m), weight 215 lb 6.4 oz (97.7 kg), last menstrual period 05/02/2019, SpO2 99 %. Body mass index is 35.84 kg/m. Physical Exam Vitals signs reviewed.  Constitutional:      Appearance: Normal appearance. She is obese.  Cardiovascular:     Rate and Rhythm: Normal rate.     Pulses: Normal pulses.  Pulmonary:     Effort: Pulmonary effort is normal.     Breath sounds: Normal breath sounds.  Musculoskeletal: Normal range of motion.  Skin:    General: Skin is warm and dry.  Neurological:     Mental Status: She is alert and oriented to person, place, and time.  Psychiatric:        Mood and Affect: Mood normal.        Behavior:  Behavior normal.     RECENT LABS AND TESTS: BMET    Component Value Date/Time   NA 136 03/08/2019 1721   K 3.6 03/08/2019 1721   CL 100 03/08/2019 1721   CO2 25 03/08/2019 1721   GLUCOSE 93 03/08/2019 1721   BUN 11 03/08/2019 1721   CREATININE 0.75 03/08/2019 1721   CALCIUM 9.2 03/08/2019 1721   GFRNONAA >60 03/08/2019 1721   GFRAA >60 03/08/2019 1721   Lab Results  Component Value Date   HGBA1C 5.1 04/06/2019   Lab Results  Component Value Date   INSULIN 8.3 04/06/2019   CBC    Component Value Date/Time   WBC 8.8 03/08/2019 1721   RBC 5.38 (H) 03/08/2019 1721   HGB 16.3 (H) 03/08/2019 1721   HCT 45.4 03/08/2019 1721   PLT 283 03/08/2019 1721   MCV 84.4 03/08/2019 1721   MCH 30.3 03/08/2019 1721   MCHC 35.9 03/08/2019 1721   RDW 12.5 03/08/2019 1721   Iron/TIBC/Ferritin/ %Sat No results found for: IRON, TIBC, FERRITIN, IRONPCTSAT Lipid Panel  No results found for: CHOL, TRIG, HDL, CHOLHDL, VLDL, LDLCALC, LDLDIRECT Hepatic Function Panel     Component Value Date/Time   PROT 7.4 03/08/2019 1721   ALBUMIN 4.1 03/08/2019 1721   AST 18 03/08/2019 1721   ALT 14 03/08/2019 1721   ALKPHOS 41 03/08/2019 1721   BILITOT 0.9 03/08/2019 1721      Component Value Date/Time   TSH 5.170 (H) 04/06/2019 1013      OBESITY BEHAVIORAL INTERVENTION VISIT  Today's visit was # 3   Starting weight: 219 lbs Starting date: 04/06/2019 Today's weight : 215 lbs Today's date: 05/11/2019 Total lbs lost to date: 4    ASK: We discussed the diagnosis of obesity with Carolyn Shaw today and Carolyn Shaw agreed to give [place] permission to discuss obesity behavioral modification therapy today.  ASSESS: Carolyn Shaw has the diagnosis of obesity and her BMI today is 35.84 Carolyn Shaw is in the action stage of change   ADVISE: Carolyn Shaw was educated on the multiple health risks of obesity as  well as the benefit of weight loss to improve her health. She was advised of the need for long term treatment  and the importance of lifestyle modifications to improve her current health and to decrease her risk of future health problems.  AGREE: Multiple dietary modification options and treatment options were discussed and  Carolyn Shaw agreed to follow the recommendations documented in the above note.  ARRANGE: Carolyn Shaw was educated on the importance of frequent visits to treat obesity as outlined per CMS and USPSTF guidelines and agreed to schedule her next follow up appointment today.  Carolyn Shaw, am acting as transcriptionist for [employer], DO  I have reviewed the above documentation for accuracy and completeness, and I agree with the above. -Jearld Lesch, DO

## 2019-05-13 NOTE — Progress Notes (Signed)
Office: [phone]  /  Fax: [phone]    Date: May 27, 2019   Appointment Start Time: 2:00pm Duration: 29 minutes Provider: Glennie Isle, Psy.D. Type of Session: Individual Therapy  Location of Patient: Work Biomedical scientist of Provider: Provider's Home Type of Contact: Telepsychological Visit via [employer]   Session Content: Carolyn Shaw is a 43 y.o. female presenting via [place] for a follow-up appointment to address the previously established treatment goal of decreasing emotional eating. Today's appointment was a telepsychological visit, as this provider's clinic is seeing a limited number of patients for in-person visits due to COVID-19. Therapeutic services will resume to in-person appointments once deemed appropriate. [name] expressed understanding regarding the rationale for telepsychological services, and provided verbal consent for today's appointment. Prior to proceeding with today's appointment, Carolyn Shaw's physical location at the time of this appointment was obtained. Carolyn Shaw reported she was at work and provided the address. In the event of technical difficulties, [name] shared a phone number she could be reached at. [name] and this provider participated in today's telepsychological service. Also, [name] denied anyone else being present in the room or on the WebEx appointment.  This provider conducted a brief check-in and verbally administered the PHQ-9 and GAD-7. Carolyn Shaw shared she had a migraine at the time of the appointment, but was receptive to proceeding. She also shared her friend's mother in law passed away and another friend's mother passed away. Associated feelings and thoughts were processed. Regarding eating, Vylet noted the aforementioned has contributed to emotional eating. [name] expressed desire to re-initiate longer-term therapeutic services and noted a plan to reach out to [facility] to meet wit her previous therapist. This provider shared she would be able to place a  referral if needed as well. Psychoeducation regarding mindfulness was provided to assist with coping. A handout was provided to [facility] with further information regarding mindfulness, including exercises. This provider also explained the benefit of mindfulness as it relates to emotional eating. Carolyn Shaw was encouraged to engage in the provided exercises between now and the next appointment with this provider. Carolyn Shaw agreed. She was led through an exercise focusing on her senses during today's appointment. Carolyn Shaw provided verbal consent during today's appointment for this provider to send a handout about mindfulness via e-mail. Overall, Carolyn Shaw was receptive to today's session as evidenced by openness to sharing, responsiveness to feedback, and willingness to engage in mindfulness exercises.  Mental Status Examination:  Appearance: neat Behavior: cooperative Mood: euthymic Affect: mood congruent; tearful when discussing recent losses  Speech: normal in rate, volume, and tone Eye Contact: appropriate Psychomotor Activity: appropriate Thought Process: linear, logical, and goal directed  Content/Perceptual Disturbances: no hallucinations, delusions, bizarre thinking or behavior reported or observed and no evidence of suicidal and homicidal ideation, plan, and intent Orientation: time, person, place and purpose of appointment Cognition/Sensorium: memory, attention, language, and fund of knowledge intact  Insight: good Judgment: good  Structured Assessment Results: The Patient Health Questionnaire-9 (PHQ-9) is a self-report measure that assesses symptoms and severity of depression over the course of the last two weeks. [name] obtained a score of 4 suggesting minimal depression. [name] finds the endorsed symptoms to be not difficult at all. Little interest or pleasure in doing things 1  Feeling down, depressed, or hopeless 1  Trouble falling or staying asleep, or sleeping too much 1  Feeling tired or having  little energy 0  Poor appetite or overeating 0  Feeling bad about yourself --- or that you are a failure or have let yourself  or your family down 1  Trouble concentrating on things, such as reading the newspaper or watching television 0  Moving or speaking so slowly that other people could have noticed? Or the opposite --- being so fidgety or restless that you have been moving around a lot more than usual 0  Thoughts that you would be better off dead or hurting yourself in some way 0  PHQ-9 Score 4    The Generalized Anxiety Disorder-7 (GAD-7) is a brief self-report measure that assesses symptoms of anxiety over the course of the last two weeks. [name] obtained a score of 2 suggesting minimal anxiety. [place] finds the endorsed symptoms to be not difficult at all. Feeling nervous, anxious, on edge 1  Not being able to stop or control worrying 0  Worrying too much about different things 0  Trouble relaxing 1  Being so restless that it's hard to sit still 0  Becoming easily annoyed or irritable 0  Feeling afraid as if something awful might happen 0  GAD-7 Score 2   Interventions:  Conducted a brief chart review Verbal administration of PHQ-9 and GAD-7 for symptom monitoring Provided empathic reflections and validation Reviewed content from the previous session Processed thoughts and feelings Psychoeducation provided regarding mindfulness Engaged patient in a mindfulness exercise Employed supportive psychotherapy interventions to facilitate reduced distress, and to improve coping skills with identified stressors Employed acceptance and commitment interventions to emphasize mindfulness and acceptance without struggle  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 [name] has demonstrated progress in  her goal as evidenced by increased awareness of hunger patterns and triggers for emotional eating. [name] also reported demonstrates willingness to engage in mindfulness exercises.  Plan: Timika continues to appear able and willing to participate as evidenced by engagement in reciprocal conversation, and asking questions for clarification as appropriate. The next appointment will be scheduled in three weeks, which will be via [employer]. The next session will focus further on mindfulness.

## 2019-05-14 ENCOUNTER — Encounter (INDEPENDENT_AMBULATORY_CARE_PROVIDER_SITE_OTHER): Payer: Self-pay | Admitting: Bariatrics

## 2019-05-26 ENCOUNTER — Other Ambulatory Visit: Payer: Self-pay | Admitting: Gastroenterology

## 2019-05-26 ENCOUNTER — Other Ambulatory Visit (HOSPITAL_COMMUNITY): Payer: Self-pay | Admitting: Gastroenterology

## 2019-05-26 DIAGNOSIS — R1013 Epigastric pain: Secondary | ICD-10-CM

## 2019-05-27 ENCOUNTER — Ambulatory Visit (INDEPENDENT_AMBULATORY_CARE_PROVIDER_SITE_OTHER): Payer: BC Managed Care – PPO | Admitting: Psychology

## 2019-05-27 ENCOUNTER — Other Ambulatory Visit: Payer: Self-pay

## 2019-05-27 DIAGNOSIS — F418 Other specified anxiety disorders: Secondary | ICD-10-CM

## 2019-06-01 ENCOUNTER — Ambulatory Visit (HOSPITAL_COMMUNITY)
Admission: RE | Admit: 2019-06-01 | Discharge: 2019-06-01 | Disposition: A | Discharge status: Home / Self Care | Payer: BC Managed Care – PPO | Source: Ambulatory Visit | Attending: Gastroenterology | Admitting: Gastroenterology

## 2019-06-01 ENCOUNTER — Encounter (INDEPENDENT_AMBULATORY_CARE_PROVIDER_SITE_OTHER): Payer: Self-pay | Admitting: Bariatrics

## 2019-06-01 ENCOUNTER — Other Ambulatory Visit: Payer: Self-pay

## 2019-06-01 ENCOUNTER — Ambulatory Visit (INDEPENDENT_AMBULATORY_CARE_PROVIDER_SITE_OTHER): Payer: BC Managed Care – PPO | Admitting: Bariatrics

## 2019-06-01 VITALS — BP 114/75 | HR 78 | Temp 98.7°F | Ht 65.0 in | Wt 214.0 lb

## 2019-06-01 DIAGNOSIS — R1013 Epigastric pain: Secondary | ICD-10-CM | POA: Diagnosis not present

## 2019-06-01 DIAGNOSIS — F3289 Other specified depressive episodes: Secondary | ICD-10-CM

## 2019-06-01 DIAGNOSIS — Z6835 Body mass index (BMI) 35.0-35.9, adult: Secondary | ICD-10-CM

## 2019-06-01 DIAGNOSIS — E559 Vitamin D deficiency, unspecified: Secondary | ICD-10-CM

## 2019-06-01 MED ORDER — TECHNETIUM TC 99M MEBROFENIN IV KIT
5.2600 | PACK | Freq: Once | INTRAVENOUS | Status: AC | PRN
  Administered 2019-06-01: 5.26 via INTRAVENOUS

## 2019-06-02 ENCOUNTER — Encounter (INDEPENDENT_AMBULATORY_CARE_PROVIDER_SITE_OTHER): Payer: Self-pay | Admitting: Bariatrics

## 2019-06-02 NOTE — Progress Notes (Signed)
Office: [phone]  /  Fax: [phone]   HPI:   Chief Complaint: OBESITY Carolyn Shaw is here to discuss her progress with her obesity treatment plan. She is on the Category 3 plan and is following her eating plan approximately 30% of the time. She states she is dancing 15 minutes 3 times per week. [name] has lost 1 lb in 3 weeks (GI testing and had a birthday). She states she has increased her water intake. Her weight is 214 lb (97.1 kg) today and has had a weight loss of 1 pound over a period of 3 weeks since her last visit. She has lost 5 lbs since starting treatment with [place].  Vitamin D deficiency Carolyn Shaw has a diagnosis of Vitamin D deficiency. Last Vitamin D 30.5 on 04/06/2019. She is currently taking Vit D and denies nausea, vomiting or muscle weakness.  Depression with emotional eating behaviors Carolyn Shaw is struggling with emotional eating and using food for comfort to the extent that it is negatively impacting her health. She often snacks when she is not hungry. Tommy sometimes feels she is out of control and then feels guilty that she made poor food choices. She has been working on behavior modification techniques to help reduce her emotional eating and has been somewhat successful. Carolyn Shaw has seen Dr. Mallie Mussel. She reports a decrease in her stress eating. She shows no sign of suicidal or homicidal ideations.  Depression screen [facility] 2/9 04/06/2019  Decreased Interest 0  Down, Depressed, Hopeless 1  PHQ - 2 Score 1  Altered sleeping 0  Tired, decreased energy 1  Change in appetite 2  Feeling bad or failure about yourself  1  Trouble concentrating 1  Moving slowly or fidgety/restless 0  Suicidal thoughts 0  PHQ-9 Score 6  Difficult doing work/chores Not difficult at all   ASSESSMENT AND PLAN:  Vitamin D deficiency  Other depression  Class 2 severe obesity with serious comorbidity and body mass index (BMI) of 35.0 to 35.9 in adult, unspecified obesity type (HCC)  PLAN:  Vitamin D  Deficiency [name] was informed that low Vitamin D levels contributes to fatigue and are associated with obesity, breast, and colon cancer. She agrees to continue taking Vit D and will follow-up for routine testing of Vitamin D, at least 2-3 times per year. She was informed of the risk of over-replacement of Vitamin D and agrees to not increase her dose unless she discusses this with [place] first. Aprilmarie agrees to follow-up with our clinic in 2 weeks.  Depression with Emotional Eating Behaviors We discussed behavior modification techniques today to help Carolyn Shaw deal with her emotional eating and depression. Carolyn Shaw will continue to see Dr.  Mallie Mussel and follow-up as directed.  I spent > than 50% of the 15 minute visit on counseling as documented in the note.  Obesity Carolyn Shaw is currently in the action stage of change. As such, her goal is to continue with weight loss efforts. She has agreed to follow the Category 3 plan. Carolyn Shaw will work on meal planning, intentional eating, increasing her water intake, and staying consistent on the plan. Carolyn Shaw has been instructed to continue to dance (short U-tube videos) for weight loss and overall health benefits. We discussed the following Behavioral Modification Strategies today: increasing lean protein intake, decreasing simple carbohydrates, increasing vegetables, increase H20 intake, decrease eating out, no skipping meals, work on meal planning and easy cooking plans, keeping healthy foods in the home, and planning for success.  Carolyn Shaw has agreed to follow-up with  our clinic in 2 weeks. She was informed of the importance of frequent follow-up visits to maximize her success with intensive lifestyle modifications for her multiple health conditions.  ALLERGIES: Allergies  Allergen Reactions  . Chocolate Hives  . Erythromycin Nausea And Vomiting    MEDICATIONS: Current Outpatient Medications on File Prior to Visit  Medication Sig Dispense Refill  .  aspirin-acetaminophen-caffeine (EXCEDRIN MIGRAINE) 250-250-65 MG per tablet Take by mouth every 6 (six) hours as needed for headache or migraine.    . cyclobenzaprine (FLEXERIL) 10 MG tablet Take 10 mg by mouth 3 (three) times daily as needed for muscle spasms.    . famotidine (PEPCID) 20 MG tablet Take 1 tablet (20 mg total) by mouth 2 (two) times daily. 30 tablet 0  . ibuprofen (ADVIL,MOTRIN) 800 MG tablet Take 800 mg by mouth every 8 (eight) hours as needed.    . Multiple Vitamin (MULTIVITAMIN) tablet Take 1 tablet by mouth daily.    . rizatriptan (MAXALT) 10 MG tablet Take 1 tablet (10 mg total) by mouth as needed for migraine. May repeat in 2 hours if needed 30 tablet 3  . sucralfate (CARAFATE) 1 g tablet Take 1 tablet (1 g total) by mouth 4 (four) times daily -  with meals and at bedtime. 60 tablet 1  . topiramate (TOPAMAX) 50 MG tablet Take 1 tablet (50 mg total) by mouth at bedtime. 90 tablet 3  . tranexamic acid (LYSTEDA) 650 MG TABS tablet Take 1,300 mg by mouth 3 (three) times daily.    . Vitamin D, Ergocalciferol, (DRISDOL) 1.25 MG (50000 UT) CAPS capsule Take 1 capsule (50,000 Units total) by mouth every 7 (seven) days. 4 capsule 0   No current facility-administered medications on file prior to visit.     PAST MEDICAL HISTORY: Past Medical History:  Diagnosis Date  . Anxiety   . Back pain   . Chest pain   . Constipation   . Depression   . Fibroids   . Food allergy   . GERD (gastroesophageal reflux disease)   . Lactose intolerance   . Migraines   . Seasonal allergies   . Vitamin D deficiency     PAST SURGICAL HISTORY: History reviewed. No pertinent surgical history.  SOCIAL HISTORY: Social History   Tobacco Use  . Smoking status: Never Smoker  . Smokeless tobacco: Never Used  Substance Use Topics  . Alcohol use: No    Alcohol/week: 0.0 standard drinks  . Drug use: No    FAMILY HISTORY: Family History  Problem Relation Age of Onset  . Diabetes Mother   .  High blood pressure Mother   . Thyroid disease Mother   . Sleep apnea Mother   . Obesity Mother   . Diabetes Father   . High blood pressure Father   . Stroke Father   . Drug abuse Father   . Obesity Father   . Ataxia Neg Hx   . [place] Neg Hx   . Dementia Neg Hx   . Mental retardation Neg Hx   . Migraines Neg Hx   . Multiple sclerosis Neg Hx   . Neurofibromatosis Neg Hx   . Neuropathy Neg Hx   . Parkinsonism Neg Hx   . Seizures Neg Hx    ROS: Review of Systems  Gastrointestinal: Negative for nausea and vomiting.  Musculoskeletal:       Negative for muscle weakness.  Psychiatric/Behavioral: Positive for depression (emotional eating). Negative for suicidal ideas.  Negative for homicidal ideas.   PHYSICAL EXAM: Blood pressure 114/75, pulse 78, temperature 98.7 F (37.1 C), temperature source Oral, height 5\' 5"  (1.651 m), weight 214 lb (97.1 kg), last menstrual period 05/28/2019, SpO2 99 %. Body mass index is 35.61 kg/m. Physical Exam Vitals signs reviewed.  Constitutional:      Appearance: Normal appearance. She is obese.  Cardiovascular:     Rate and Rhythm: Normal rate.     Pulses: Normal pulses.  Pulmonary:     Effort: Pulmonary effort is normal.     Breath sounds: Normal breath sounds.  Musculoskeletal: Normal range of motion.  Skin:    General: Skin is warm and dry.  Neurological:     Mental Status: She is alert and oriented to person, place, and time.  Psychiatric:        Behavior: Behavior normal.   RECENT LABS AND TESTS: BMET    Component Value Date/Time   NA 136 03/08/2019 1721   K 3.6 03/08/2019 1721   CL 100 03/08/2019 1721   CO2 25 03/08/2019 1721   GLUCOSE 93 03/08/2019 1721   BUN 11 03/08/2019 1721   CREATININE 0.75 03/08/2019 1721   CALCIUM 9.2 03/08/2019 1721   GFRNONAA >60 03/08/2019 1721   GFRAA >60 03/08/2019 1721   Lab Results  Component Value Date   HGBA1C 5.1 04/06/2019   Lab Results  Component Value Date   INSULIN 8.3  04/06/2019   CBC    Component Value Date/Time   WBC 8.8 03/08/2019 1721   RBC 5.38 (H) 03/08/2019 1721   HGB 16.3 (H) 03/08/2019 1721   HCT 45.4 03/08/2019 1721   PLT 283 03/08/2019 1721   MCV 84.4 03/08/2019 1721   MCH 30.3 03/08/2019 1721   MCHC 35.9 03/08/2019 1721   RDW 12.5 03/08/2019 1721   Iron/TIBC/Ferritin/ %Sat No results found for: IRON, TIBC, FERRITIN, IRONPCTSAT Lipid Panel  No results found for: CHOL, TRIG, HDL, CHOLHDL, VLDL, LDLCALC, LDLDIRECT Hepatic Function Panel     Component Value Date/Time   PROT 7.4 03/08/2019 1721   ALBUMIN 4.1 03/08/2019 1721   AST 18 03/08/2019 1721   ALT 14 03/08/2019 1721   ALKPHOS 41 03/08/2019 1721   BILITOT 0.9 03/08/2019 1721      Component Value Date/Time   TSH 5.170 (H) 04/06/2019 1013   Results for HALLE, HOPPS (MRN [id number]) as of 06/02/2019 14:29  Ref. Range 04/06/2019 10:13  Vitamin D, 25-Hydroxy Latest Ref Range: 30.0 - 100.0 ng/mL 30.5   OBESITY BEHAVIORAL INTERVENTION VISIT  Today's visit was #4  Starting weight: 219 lbs Starting date: 04/06/2019 Today's weight: 214 lbs  Today's date: 06/01/2019 Total lbs lost to date: 5    06/01/2019  Height 5\' 5"  (1.651 m)  Weight 214 lb (97.1 kg)  BMI (Calculated) 35.61  BLOOD PRESSURE - SYSTOLIC [id number]  BLOOD PRESSURE - DIASTOLIC 75   Body Fat % [id number] %  Total Body Water (lbs) 85 lbs   ASK: We discussed the diagnosis of obesity with Corlis Hove today and Barry agreed to give [place] permission to discuss obesity behavioral modification therapy today.  ASSESS: Harrietta has the diagnosis of obesity and her BMI today is 35.6. Maudy is in the action stage of change.   ADVISE: Eulalah was educated on the multiple health risks of obesity as well as the benefit of weight loss to improve her health. She was advised of the need for long term treatment and the importance of lifestyle modifications to  improve her current health and to decrease her risk of future health  problems.  AGREE: Multiple dietary modification options and treatment options were discussed and  Courteney agreed to follow the recommendations documented in the above note.  ARRANGE: Takirah was educated on the importance of frequent visits to treat obesity as outlined per CMS and USPSTF guidelines and agreed to schedule her next follow up appointment today.  Migdalia Dk, am acting as Location manager for [employer], DO  I have reviewed the above documentation for accuracy and completeness, and I agree with the above. -Jearld Lesch, DO

## 2019-06-09 NOTE — Progress Notes (Signed)
Office: [phone]  /  Fax: [phone]    Date: June 22, 2019   Appointment Start Time: 4:30pm Duration: 34 minutes Provider: Glennie Isle, Psy.D. Type of Session: Individual Therapy  Location of Patient: Home Location of Provider: Provider's Home Type of Contact: Telepsychological Visit via [employer] Web[employer]   Session Content: Carolyn Shaw is a 43 y.o. female presenting via [place] for a follow-up appointment to address the previously established treatment goal of decreasing emotional eating. Today's appointment was a telepsychological visit, as it is an option for appointments to reduce exposure to COVID-19. Carolyn Shaw expressed understanding regarding the rationale for telepsychological services, and provided verbal consent for today's appointment. Prior to proceeding with today's appointment, Carolyn Shaw physical location at the time of this appointment was obtained. Carolyn Shaw reported she was at home and provided the address. In the event of technical difficulties, Carolyn Shaw shared a phone number she could be reached at. Carolyn Shaw and this provider participated in today's telepsychological service. Also, Carolyn Shaw denied anyone else being present in the room or on the WebEx appointment.  This provider conducted a brief check-in. Carolyn Shaw shared about recent events, including applying for jobs. She described having "ups and downs with frustration." Carolyn Shaw stated she called [facility] and she received the new patient paperwork. She indicated she will be scheduled for an appointment once her paperwork is complete. Regarding eating, Carolyn Shaw stated she has moments of "sugar cravings." She also shared previously skipping meals, but noted she has not skipped any meals in the past week. She was engaged in problem solving and Carolyn Shaw shared a plan to take snacks to work that she can have in different locations to ensure she is consuming recommended calories/protein throughout the day. Notably, Carolyn Shaw shared she has been engaging  in mindfulness. Psychoeducation regarding formal (e.g., setting aside a specific time daily to engage in an exercise) and informal (e.g., cultivating awareness in the present moment and taking a non-judgmental approach while engaging in day-to-day tasks) mindfulness was provided. This provider also discussed the utilization of YouTube for mindfulness exercises (e.g., videos by Merri Ray). Carolyn Shaw also requested recommendations for apps. This provider shared about Carolyn Shaw. Carolyn Shaw was receptive to today's session as evidenced by openness to sharing, responsiveness to feedback, and willingness to engage in learned skills.  Mental Status Examination:  Appearance: neat Behavior: cooperative Mood: euthymic Affect: mood congruent Speech: normal in rate, volume, and tone Eye Contact: appropriate Psychomotor Activity: appropriate Thought Process: linear, logical, and goal directed  Content/Perceptual Disturbances: no hallucinations, delusions, bizarre thinking or behavior reported or observed and no evidence of suicidal and homicidal ideation, plan, and intent Orientation: time, person, place and purpose of appointment Cognition/Sensorium: memory, attention, language, and fund of knowledge intact  Insight: good Judgment: good  Interventions:  Conducted a brief chart review Provided empathic reflections and validation Reviewed content from the previous session Engaged patient in problem solving Psychoeducation provided regarding mindfulness Employed supportive psychotherapy interventions to facilitate reduced distress, and to improve coping skills with identified stressors  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 Carolyn Shaw has demonstrated progress in her goal as evidenced by increased awareness  of hunger patterns and triggers for emotional eating. Carolyn Shaw also demonstrates willingness to engage in mindfulness exercises.  Plan: Carolyn Shaw continues to appear able and willing to participate as evidenced by engagement in reciprocal conversation, and asking questions for clarification as  appropriate. The next appointment will be scheduled in two weeks, which will be via [employer]. The next session will focus further on mindfulness.

## 2019-06-16 ENCOUNTER — Other Ambulatory Visit: Payer: Self-pay

## 2019-06-16 ENCOUNTER — Encounter (INDEPENDENT_AMBULATORY_CARE_PROVIDER_SITE_OTHER): Payer: Self-pay | Admitting: Bariatrics

## 2019-06-16 ENCOUNTER — Ambulatory Visit (INDEPENDENT_AMBULATORY_CARE_PROVIDER_SITE_OTHER): Payer: BC Managed Care – PPO | Admitting: Bariatrics

## 2019-06-16 VITALS — BP 113/71 | HR 88 | Temp 98.7°F | Ht 65.0 in | Wt 216.0 lb

## 2019-06-16 DIAGNOSIS — E559 Vitamin D deficiency, unspecified: Secondary | ICD-10-CM

## 2019-06-16 DIAGNOSIS — Z9189 Other specified personal risk factors, not elsewhere classified: Secondary | ICD-10-CM

## 2019-06-16 DIAGNOSIS — F3289 Other specified depressive episodes: Secondary | ICD-10-CM | POA: Diagnosis not present

## 2019-06-16 DIAGNOSIS — Z6835 Body mass index (BMI) 35.0-35.9, adult: Secondary | ICD-10-CM

## 2019-06-17 MED ORDER — VITAMIN D (ERGOCALCIFEROL) 1.25 MG (50000 UNIT) PO CAPS: 50000.0000 [IU] | ORAL_CAPSULE | ORAL | 0 refills | Status: DC

## 2019-06-17 NOTE — Progress Notes (Signed)
Office: [phone]  /  Fax: [phone]   HPI:   Chief Complaint: OBESITY Carolyn Shaw is here to discuss her progress with her obesity treatment plan. She is on the Category 3 plan and is following her eating plan approximately 70% of the time. She states she is walking 2,500-7,000 steps 7 times per week. Leonardo is up 2 lbs. She states that she has been eating more carbohydrates and has skipped meals. She is up 2.4 lbs of water weight. Her weight is 216 lb (98 kg) today and has had a weight gain of 2 lbs since her last visit. She has lost 3 lbs since starting treatment with [place].  Vitamin D deficiency Carolyn Shaw has a diagnosis of Vitamin D deficiency. Last Vitamin D 30.5 on 04/06/2019. She is currently taking prescription Vit D and denies nausea, vomiting or muscle weakness.  At risk for osteopenia and osteoporosis Carolyn Shaw is at higher risk of osteopenia and osteoporosis due to vitamin D deficiency.   Depression, Other Carolyn Shaw is struggling with emotional eating and using food for comfort to the extent that it is negatively impacting her health. She often snacks when she is not hungry. Carolyn Shaw sometimes feels she is out of control and then feels guilty that she made poor food choices. She has been working on behavior modification techniques to help reduce her emotional eating and has been somewhat successful. Carolyn Shaw reports stress eating. She shows no sign of suicidal or homicidal ideations.  Depression screen [facility] 2/9 04/06/2019  Decreased Interest 0  Down, Depressed, Hopeless 1  PHQ - 2 Score 1  Altered sleeping 0  Tired, decreased energy 1  Change in appetite 2  Feeling bad or failure about yourself  1  Trouble concentrating 1  Moving slowly or fidgety/restless 0  Suicidal thoughts 0  PHQ-9 Score 6  Difficult doing work/chores Not difficult at all   ASSESSMENT AND PLAN:  Vitamin D deficiency - Plan: Vitamin D, Ergocalciferol, (DRISDOL) 1.25 MG (50000 UT) CAPS capsule  Other depression - with  emotional eating  At risk for osteoporosis  Class 2 severe obesity with serious comorbidity and body mass index (BMI) of 35.0 to 35.9 in adult, unspecified obesity type (HCC)  PLAN:  Vitamin D Deficiency Carolyn Shaw was informed that low Vitamin D levels contributes to fatigue and are associated with obesity, breast, and colon cancer. She agrees to continue to take prescription Vit D @ 50,000 IU every week #4 with 0 refills and will follow-up for routine testing of Vitamin D, at least 2-3 times per year. She was informed of the risk of over-replacement of Vitamin D and agrees to not increase her dose unless she discusses this with [place] first. Jazzman agrees to follow-up with our clinic in 2 weeks.  At risk for osteopenia and osteoporosis Carolyn Shaw was given extended  (15 minutes) osteoporosis prevention counseling today. Carolyn Shaw is at risk for osteopenia and osteoporosis due to her Vitamin D deficiency. She was encouraged to take her Vitamin D and follow her higher calcium diet and increase strengthening exercise to help strengthen her bones and decrease her risk of osteopenia and osteoporosis.  Depression, Other We discussed CBT strategies with Carolyn Shaw. She will follow-up as directed to monitor her progress.  Obesity Carolyn Shaw is currently in the action stage of change. As such, her goal is to continue with weight loss efforts. She has agreed to follow the Category 3 plan. Carolyn Shaw will work on meal planning, intentional eating, will not skip meals, and will choose protein snacks.  Carolyn Shaw has been instructed to continue walking for weight loss and overall health benefits. We discussed the following Behavioral Modification Strategies today: increasing lean protein intake, decreasing simple carbohydrates, increasing vegetables, increase H20 intake, decrease eating out, no skipping meals, work on meal planning and easy cooking plans, keeping healthy foods in the home, and planning for success.  Carolyn Shaw has agreed to  follow-up with our clinic in 2 weeks. She was informed of the importance of frequent follow-up visits to maximize her success with intensive lifestyle modifications for her multiple health conditions.  ALLERGIES: Allergies  Allergen Reactions  . Chocolate Hives  . Erythromycin Nausea And Vomiting    MEDICATIONS: Current Outpatient Medications on File Prior to Visit  Medication Sig Dispense Refill  . aspirin-acetaminophen-caffeine (EXCEDRIN MIGRAINE) 250-250-65 MG per tablet Take by mouth every 6 (six) hours as needed for headache or migraine.    Marland Kitchen ibuprofen (ADVIL,MOTRIN) 800 MG tablet Take 800 mg by mouth every 8 (eight) hours as needed.    . Multiple Vitamin (MULTIVITAMIN) tablet Take 1 tablet by mouth daily.    . rizatriptan (MAXALT) 10 MG tablet Take 1 tablet (10 mg total) by mouth as needed for migraine. May repeat in 2 hours if needed 30 tablet 3  . sucralfate (CARAFATE) 1 g tablet Take 1 tablet (1 g total) by mouth 4 (four) times daily -  with meals and at bedtime. 60 tablet 1  . topiramate (TOPAMAX) 50 MG tablet Take 1 tablet (50 mg total) by mouth at bedtime. 90 tablet 3  . Vitamin D, Ergocalciferol, (DRISDOL) 1.25 MG (50000 UT) CAPS capsule Take 1 capsule (50,000 Units total) by mouth every 7 (seven) days. 4 capsule 0  . famotidine (PEPCID) 20 MG tablet Take 1 tablet (20 mg total) by mouth 2 (two) times daily. (Patient not taking: Reported on 06/16/2019) 30 tablet 0   No current facility-administered medications on file prior to visit.     PAST MEDICAL HISTORY: Past Medical History:  Diagnosis Date  . Anxiety   . Back pain   . Chest pain   . Constipation   . Depression   . Fibroids   . Food allergy   . GERD (gastroesophageal reflux disease)   . Lactose intolerance   . Migraines   . Seasonal allergies   . Vitamin D deficiency     PAST SURGICAL HISTORY: History reviewed. No pertinent surgical history.  SOCIAL HISTORY: Social History   Tobacco Use  . Smoking  status: Never Smoker  . Smokeless tobacco: Never Used  Substance Use Topics  . Alcohol use: No    Alcohol/week: 0.0 standard drinks  . Drug use: No    FAMILY HISTORY: Family History  Problem Relation Age of Onset  . Diabetes Mother   . High blood pressure Mother   . Thyroid disease Mother   . Sleep apnea Mother   . Obesity Mother   . Diabetes Father   . High blood pressure Father   . Stroke Father   . Drug abuse Father   . Obesity Father   . Ataxia Neg Hx   . [place] Neg Hx   . Dementia Neg Hx   . Mental retardation Neg Hx   . Migraines Neg Hx   . Multiple sclerosis Neg Hx   . Neurofibromatosis Neg Hx   . Neuropathy Neg Hx   . Parkinsonism Neg Hx   . Seizures Neg Hx    ROS: Review of Systems  Gastrointestinal: Negative for nausea and vomiting.  Musculoskeletal:       Negative for muscle weakness.  Psychiatric/Behavioral: Positive for depression. Negative for suicidal ideas.       Negative for homicidal ideas.   PHYSICAL EXAM: Blood pressure 113/71, pulse 88, temperature 98.7 F (37.1 C), temperature source Oral, height 5\' 5"  (1.651 m), weight 216 lb (98 kg), last menstrual period 05/28/2019, SpO2 96 %. Body mass index is 35.94 kg/m. Physical Exam Vitals signs reviewed.  Constitutional:      Appearance: Normal appearance. She is obese.  Cardiovascular:     Rate and Rhythm: Normal rate.     Pulses: Normal pulses.  Pulmonary:     Effort: Pulmonary effort is normal.     Breath sounds: Normal breath sounds.  Musculoskeletal: Normal range of motion.  Skin:    General: Skin is warm and dry.  Neurological:     Mental Status: She is alert and oriented to person, place, and time.  Psychiatric:        Behavior: Behavior normal.   RECENT LABS AND TESTS: BMET    Component Value Date/Time   NA 136 03/08/2019 1721   K 3.6 03/08/2019 1721   CL 100 03/08/2019 1721   CO2 25 03/08/2019 1721   GLUCOSE 93 03/08/2019 1721   BUN 11 03/08/2019 1721   CREATININE 0.75  03/08/2019 1721   CALCIUM 9.2 03/08/2019 1721   GFRNONAA >60 03/08/2019 1721   GFRAA >60 03/08/2019 1721   Lab Results  Component Value Date   HGBA1C 5.1 04/06/2019   Lab Results  Component Value Date   INSULIN 8.3 04/06/2019   CBC    Component Value Date/Time   WBC 8.8 03/08/2019 1721   RBC 5.38 (H) 03/08/2019 1721   HGB 16.3 (H) 03/08/2019 1721   HCT 45.4 03/08/2019 1721   PLT 283 03/08/2019 1721   MCV 84.4 03/08/2019 1721   MCH 30.3 03/08/2019 1721   MCHC 35.9 03/08/2019 1721   RDW 12.5 03/08/2019 1721   Iron/TIBC/Ferritin/ %Sat No results found for: IRON, TIBC, FERRITIN, IRONPCTSAT Lipid Panel  No results found for: CHOL, TRIG, HDL, CHOLHDL, VLDL, LDLCALC, LDLDIRECT Hepatic Function Panel     Component Value Date/Time   PROT 7.4 03/08/2019 1721   ALBUMIN 4.1 03/08/2019 1721   AST 18 03/08/2019 1721   ALT 14 03/08/2019 1721   ALKPHOS 41 03/08/2019 1721   BILITOT 0.9 03/08/2019 1721      Component Value Date/Time   TSH 5.170 (H) 04/06/2019 1013   Results for ZAYDI, GILLELAND (MRN [id number]) as of 06/17/2019 15:00  Ref. Range 04/06/2019 10:13  Vitamin D, 25-Hydroxy Latest Ref Range: 30.0 - 100.0 ng/mL 30.5   OBESITY BEHAVIORAL INTERVENTION VISIT  Today's visit was #5   Starting weight: 219 lbs Starting date: 04/06/2019 Today's weight: 216 lbs Today's date: 06/16/2019 Total lbs lost to date: 3    06/16/2019  Height 5\' 5"  (1.651 m)  Weight 216 lb (98 kg)  BMI (Calculated) 35.94  BLOOD PRESSURE - SYSTOLIC 123456  BLOOD PRESSURE - DIASTOLIC 71   Body Fat % 44 %  Total Body Water (lbs) 87.4 lbs   ASK: We discussed the diagnosis of obesity with Carolyn Shaw today and Carolyn Shaw agreed to give [place] permission to discuss obesity behavioral modification therapy today.  ASSESS: Carolyn Shaw has the diagnosis of obesity and her BMI today is 35.9. Carolyn Shaw is in the action stage of change.   ADVISE: Carolyn Shaw was educated on the multiple health risks of obesity as  well  as the benefit of weight loss to improve her health. She was advised of the need for long term treatment and the importance of lifestyle modifications to improve her current health and to decrease her risk of future health problems.  AGREE: Multiple dietary modification options and treatment options were discussed and  Carolyn Shaw agreed to follow the recommendations documented in the above note.  ARRANGE: Galyn was educated on the importance of frequent visits to treat obesity as outlined per CMS and USPSTF guidelines and agreed to schedule her next follow up appointment today.  Migdalia Dk, am acting as Location manager for [employer], DO  I have reviewed the above documentation for accuracy and completeness, and I agree with the above. -Jearld Lesch, DO

## 2019-06-18 ENCOUNTER — Encounter (INDEPENDENT_AMBULATORY_CARE_PROVIDER_SITE_OTHER): Payer: Self-pay | Admitting: Bariatrics

## 2019-06-22 ENCOUNTER — Ambulatory Visit (INDEPENDENT_AMBULATORY_CARE_PROVIDER_SITE_OTHER): Payer: BC Managed Care – PPO | Admitting: Psychology

## 2019-06-22 ENCOUNTER — Other Ambulatory Visit: Payer: Self-pay

## 2019-06-22 DIAGNOSIS — F418 Other specified anxiety disorders: Secondary | ICD-10-CM | POA: Diagnosis not present

## 2019-06-25 NOTE — Progress Notes (Signed)
Office: [phone]  /  Fax: [phone]    Date: July 06, 2019   Appointment Start Time: 11:38am Duration: 27 minutes Provider: Glennie Isle, Psy.D. Type of Session: Individual Therapy  Location of Patient: Work Biomedical scientist of Provider: Provider's Home Type of Contact: Telepsychological Visit via [employer]   Session Content: Carolyn Shaw is a 43 y.o. female presenting via [place] for a follow-up appointment to address the previously established treatment goal of decreasing emotional eating. Today's appointment was a telepsychological visit, as it is an option for appointments to reduce exposure to COVID-19. Aditi expressed understanding regarding the rationale for telepsychological services, and provided verbal consent for today's appointment. Prior to proceeding with today's appointment, [name]'s physical location at the time of this appointment was obtained. In the event of technical difficulties, [name] shared a phone number she could be reached at. [name] and this provider participated in today's telepsychological service. Also, [name] denied anyone else being present in the room or on the WebEx appointment.  This provider conducted a brief check-in. [name] shared she started completing paperwork for [facility] to schedule an appointment, and noted a plan to finish it as soon as possible. She also indicated deviations from the meal plan. Additionally, [name] described "beating [herself]" secondary to it. Thus, mindfulness was further discussed. She described engaging in the suggested YouTube exercises. [name] reported she often "reflect[s] on the day" when engaging in mindfulness exercises; therefore, described worry about whether she was doing the exercises correctly. This provider normalized her experience and encouraged her to focus on gently bringing her awareness back to exercise. For instance, it was recommended she tell herself "I notice my mind wandering" or "I notice myself becoming  frustrated." Moreover, [name] further described difficulty focusing on her own well-being as she is "busy taking care of everyone else." Thus, psychoeducation regarding the importance of self-care utilizing the oxygen mask metaphor was provided. Psychoeducation regarding pleasurable activities, including its impact on emotional eating and overall well-being was also provided. [name] was provided with a handout with various options of pleasurable activities, and was encouraged to engage in one activity a day and additional activities as needed when triggered to emotionally eat. [name] agreed. [name] provided verbal consent during today's appointment for this provider to send the handout about pleasurable activities via e-mail. This provider also recommended she utilize the five senses mindfulness exercise when engaging in pleasurable activities; she agreed. Overall,  [name] was receptive to today's session as evidenced by openness to sharing, responsiveness to feedback, and willingness to engage in pleasurable activities.  Mental Status Examination:  Appearance: neat Behavior: cooperative Mood: euthymic Affect: mood congruent Speech: normal in rate, volume, and tone Eye Contact: appropriate Psychomotor Activity: appropriate Thought Process: linear, logical, and goal directed  Content/Perceptual Disturbances: no hallucinations, delusions, bizarre thinking or behavior reported or observed and no evidence of suicidal and homicidal ideation, plan, and intent Orientation: time, person, place and purpose of appointment Cognition/Sensorium: memory, attention, language, and fund of knowledge intact  Insight: good Judgment: good  Interventions:  Conducted a brief chart review Provided empathic reflections and validation Reviewed content from the previous session Psychoeducation provided regarding pleasurable activities Employed motivational interviewing skills to assess patient's willingness/desire to adhere  to recommended medical treatments and assignments Employed supportive psychotherapy interventions to facilitate reduced distress, and to improve coping skills with identified stressors Psychoeducation provided regarding self-care  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 [name] has demonstrated progress in her goal as evidenced by increased awareness of hunger patterns and triggers for emotional eating. [name] also continues to demonstrate willingness to engage in learned skill(s).  Plan: [name] continues to appear able and willing to participate as evidenced by engagement in reciprocal conversation, and asking questions for clarification as appropriate. The next appointment will be scheduled in three weeks, which will be via [employer]. The next session will focus on reviewing learned skills, and working towards the established treatment goal.

## 2019-07-06 ENCOUNTER — Ambulatory Visit (INDEPENDENT_AMBULATORY_CARE_PROVIDER_SITE_OTHER): Payer: BC Managed Care – PPO | Admitting: Psychology

## 2019-07-06 ENCOUNTER — Other Ambulatory Visit: Payer: Self-pay

## 2019-07-06 DIAGNOSIS — F418 Other specified anxiety disorders: Secondary | ICD-10-CM | POA: Diagnosis not present

## 2019-07-08 ENCOUNTER — Ambulatory Visit (INDEPENDENT_AMBULATORY_CARE_PROVIDER_SITE_OTHER): Payer: BC Managed Care – PPO | Admitting: Bariatrics

## 2019-07-08 ENCOUNTER — Encounter (INDEPENDENT_AMBULATORY_CARE_PROVIDER_SITE_OTHER): Payer: Self-pay | Admitting: Bariatrics

## 2019-07-08 ENCOUNTER — Other Ambulatory Visit: Payer: Self-pay

## 2019-07-08 VITALS — BP 103/71 | HR 69 | Temp 98.5°F | Ht 65.0 in | Wt 214.0 lb

## 2019-07-08 DIAGNOSIS — Z9189 Other specified personal risk factors, not elsewhere classified: Secondary | ICD-10-CM | POA: Diagnosis not present

## 2019-07-08 DIAGNOSIS — G43109 Migraine with aura, not intractable, without status migrainosus: Secondary | ICD-10-CM | POA: Diagnosis not present

## 2019-07-08 DIAGNOSIS — E559 Vitamin D deficiency, unspecified: Secondary | ICD-10-CM

## 2019-07-08 DIAGNOSIS — Z6835 Body mass index (BMI) 35.0-35.9, adult: Secondary | ICD-10-CM

## 2019-07-08 MED ORDER — VITAMIN D (ERGOCALCIFEROL) 1.25 MG (50000 UNIT) PO CAPS: 50000.0000 [IU] | ORAL_CAPSULE | ORAL | 0 refills | Status: DC

## 2019-07-09 NOTE — Progress Notes (Signed)
Office: [phone]  /  Fax: [phone]   HPI:   Chief Complaint: OBESITY Carolyn Shaw is here to discuss her progress with her obesity treatment plan. She is on the Category 3 plan and is following her eating plan approximately 60 % of the time. She states she is exercising 0 minutes 0 times per week. Carolyn Shaw is down 2 lbs. Her meal skipping has gone down. She is doing well with her water intake.  Her weight is 214 lb (97.1 kg) today and has had a weight loss of 2 pounds over a period of 3 weeks since her last visit. She has lost 5 lbs since starting treatment with [place].  Vitamin D Deficiency Carolyn Shaw has a diagnosis of vitamin D deficiency. She is currently taking prescription Vit D and denies nausea, vomiting or muscle weakness.  At risk for osteopenia and osteoporosis Carolyn Shaw is at higher risk of osteopenia and osteoporosis due to vitamin D deficiency.   Migraine with Carolyn Shaw complains of migraines. She is taking Maxalt and Topamax.  ASSESSMENT AND PLAN:  Vitamin D deficiency - Plan: Vitamin D, Ergocalciferol, (DRISDOL) 1.25 MG (50000 UT) CAPS capsule  Migraine with aura and without status migrainosus, not intractable  At risk for osteoporosis  Class 2 severe obesity with serious comorbidity and body mass index (BMI) of 35.0 to 35.9 in adult, unspecified obesity type ([place])  PLAN:  Vitamin D Deficiency Carolyn Shaw was informed that low vitamin D levels contributes to fatigue and are associated with obesity, breast, and colon cancer. Carolyn Shaw agrees to continue taking prescription Vit D 50,000 IU every week #4 and we will refill for 1 month. She will follow up for routine testing of vitamin D, at least 2-3 times per year. She was informed of the risk of over-replacement of vitamin D and agrees to not increase her dose unless she discusses this with [place] first. Carolyn Shaw agrees to follow up with our clinic in 2 to 3 weeks.  At risk for osteopenia and osteoporosis Carolyn Shaw was given extended (15 minutes)  osteoporosis prevention counseling today. Carolyn Shaw is at risk for osteopenia and osteoporsis due to her vitamin D deficiency. She was encouraged to take her vitamin D and follow her higher calcium diet and increase strengthening exercise to help strengthen her bones and decrease her risk of osteopenia and osteoporosis.  Migraine with Carolyn Shaw is to follow up with the Neurologist. We discussed the importance of weight loss. Carolyn Shaw agrees to continue her medications and she agrees to follow up with our clinic in 2 to 3 weeks.  Obesity Carolyn Shaw is currently in the action stage of change. As such, her goal is to continue with weight loss efforts She has agreed to follow the Category 3 plan Carolyn Shaw has been instructed to work up to a goal of 150 minutes of combined cardio and strengthening exercise per week or start walking or go back to dancing for weight loss and overall health benefits. We discussed the following Behavioral Modification Strategies today: increasing lean protein intake, decreasing simple carbohydrates , increasing vegetables, decrease eating out and work on meal planning and easy cooking plans, increase H20 intake, no skipping meals, and keeping healthy foods in the home Carolyn Shaw is to plan to adhere more to the meal plan. Thanksgiving handout was given.  Carolyn Shaw has agreed to follow up with our clinic in 2 to 3 weeks. She was informed of the importance of frequent follow up visits to maximize her success with intensive lifestyle modifications for her multiple health  conditions.  ALLERGIES: Allergies  Allergen Reactions  . Chocolate Hives  . Erythromycin Nausea And Vomiting    MEDICATIONS: Current Outpatient Medications on File Prior to Visit  Medication Sig Dispense Refill  . aspirin-acetaminophen-caffeine (EXCEDRIN MIGRAINE) 250-250-65 MG per tablet Take by mouth every 6 (six) hours as needed for headache or migraine.    . famotidine (PEPCID) 20 MG tablet Take 1 tablet (20 mg total) by  mouth 2 (two) times daily. 30 tablet 0  . ibuprofen (ADVIL,MOTRIN) 800 MG tablet Take 800 mg by mouth every 8 (eight) hours as needed.    . Multiple Vitamin (MULTIVITAMIN) tablet Take 1 tablet by mouth daily.    . rizatriptan (MAXALT) 10 MG tablet Take 1 tablet (10 mg total) by mouth as needed for migraine. May repeat in 2 hours if needed 30 tablet 3  . sucralfate (CARAFATE) 1 g tablet Take 1 tablet (1 g total) by mouth 4 (four) times daily -  with meals and at bedtime. 60 tablet 1  . topiramate (TOPAMAX) 50 MG tablet Take 1 tablet (50 mg total) by mouth at bedtime. 90 tablet 3   No current facility-administered medications on file prior to visit.     PAST MEDICAL HISTORY: Past Medical History:  Diagnosis Date  . Anxiety   . Back pain   . Chest pain   . Constipation   . Depression   . Fibroids   . Food allergy   . GERD (gastroesophageal reflux disease)   . Lactose intolerance   . Migraines   . Seasonal allergies   . Vitamin D deficiency     PAST SURGICAL HISTORY: History reviewed. No pertinent surgical history.  SOCIAL HISTORY: Social History   Tobacco Use  . Smoking status: Never Smoker  . Smokeless tobacco: Never Used  Substance Use Topics  . Alcohol use: No    Alcohol/week: 0.0 standard drinks  . Drug use: No    FAMILY HISTORY: Family History  Problem Relation Age of Onset  . Diabetes Mother   . High blood pressure Mother   . Thyroid disease Mother   . Sleep apnea Mother   . Obesity Mother   . Diabetes Father   . High blood pressure Father   . Stroke Father   . Drug abuse Father   . Obesity Father   . Ataxia Neg Hx   . Chorea Neg Hx   . Dementia Neg Hx   . Mental retardation Neg Hx   . Migraines Neg Hx   . Multiple sclerosis Neg Hx   . Neurofibromatosis Neg Hx   . Neuropathy Neg Hx   . Parkinsonism Neg Hx   . Seizures Neg Hx     ROS: Review of Systems  Constitutional: Positive for weight loss.  Gastrointestinal: Negative for nausea and  vomiting.  Musculoskeletal:       Negative muscle weakness  Neurological: Positive for headaches (Migraines).    PHYSICAL EXAM: Blood pressure 103/71, pulse 69, temperature 98.5 F (36.9 C), height 5\' 5"  (1.651 m), weight 214 lb (97.1 kg), SpO2 98 %. Body mass index is 35.61 kg/m. Physical Exam Vitals signs reviewed.  Constitutional:      Appearance: Normal appearance. She is obese.  Cardiovascular:     Rate and Rhythm: Normal rate.     Pulses: Normal pulses.  Pulmonary:     Effort: Pulmonary effort is normal.     Breath sounds: Normal breath sounds.  Musculoskeletal: Normal range of motion.  Skin:  General: Skin is warm and dry.  Neurological:     Mental Status: She is alert and oriented to person, place, and time.  Psychiatric:        Mood and Affect: Mood normal.        Behavior: Behavior normal.     RECENT LABS AND TESTS: BMET    Component Value Date/Time   NA 136 03/08/2019 1721   K 3.6 03/08/2019 1721   CL 100 03/08/2019 1721   CO2 25 03/08/2019 1721   GLUCOSE 93 03/08/2019 1721   BUN 11 03/08/2019 1721   CREATININE 0.75 03/08/2019 1721   CALCIUM 9.2 03/08/2019 1721   GFRNONAA >60 03/08/2019 1721   GFRAA >60 03/08/2019 1721   Lab Results  Component Value Date   HGBA1C 5.1 04/06/2019   Lab Results  Component Value Date   INSULIN 8.3 04/06/2019   CBC    Component Value Date/Time   WBC 8.8 03/08/2019 1721   RBC 5.38 (H) 03/08/2019 1721   HGB 16.3 (H) 03/08/2019 1721   HCT 45.4 03/08/2019 1721   PLT 283 03/08/2019 1721   MCV 84.4 03/08/2019 1721   MCH 30.3 03/08/2019 1721   MCHC 35.9 03/08/2019 1721   RDW 12.5 03/08/2019 1721   Iron/TIBC/Ferritin/ %Sat No results found for: IRON, TIBC, FERRITIN, IRONPCTSAT Lipid Panel  No results found for: CHOL, TRIG, HDL, CHOLHDL, VLDL, LDLCALC, LDLDIRECT Hepatic Function Panel     Component Value Date/Time   PROT 7.4 03/08/2019 1721   ALBUMIN 4.1 03/08/2019 1721   AST 18 03/08/2019 1721   ALT 14  03/08/2019 1721   ALKPHOS 41 03/08/2019 1721   BILITOT 0.9 03/08/2019 1721      Component Value Date/Time   TSH 5.170 (H) 04/06/2019 1013      OBESITY BEHAVIORAL INTERVENTION VISIT  Today's visit was # 6   Starting weight: 219 lbs Starting date: 04/06/2019 Today's weight : 214 lbs Today's date: 07/08/2019 Total lbs lost to date: 5    ASK: We discussed the diagnosis of obesity with Corlis Hove today and Collyn agreed to give [place] permission to discuss obesity behavioral modification therapy today.  ASSESS: Hadia has the diagnosis of obesity and her BMI today is 35.61 Kyrra is in the action stage of change   ADVISE: Jakeira was educated on the multiple health risks of obesity as well as the benefit of weight loss to improve her health. She was advised of the need for long term treatment and the importance of lifestyle modifications to improve her current health and to decrease her risk of future health problems.  AGREE: Multiple dietary modification options and treatment options were discussed and  Gabriellia agreed to follow the recommendations documented in the above note.  ARRANGE: Prisila was educated on the importance of frequent visits to treat obesity as outlined per CMS and USPSTF guidelines and agreed to schedule her next follow up appointment today.  Wilhemena Durie, am acting as transcriptionist for [employer], DO  I have reviewed the above documentation for accuracy and completeness, and I agree with the above. -Jearld Lesch, DO

## 2019-07-13 ENCOUNTER — Encounter (INDEPENDENT_AMBULATORY_CARE_PROVIDER_SITE_OTHER): Payer: Self-pay | Admitting: Bariatrics

## 2019-07-15 NOTE — Progress Notes (Signed)
Office: [phone]  /  Fax: [phone]    Date: July 28, 2019   Appointment Start Time: 8:33am Duration: 31 minutes Provider: Glennie Isle, Psy.D. Type of Session: Individual Therapy  Location of Patient: Home Location of Provider: [facility] Type of Contact: Telepsychological Visit via [employer] Web[employer]   Session Content: Carolyn Shaw is a 43 y.o. female presenting via [place] for a follow-up appointment to address the previously established treatment goal of decreasing emotional eating. Today's appointment was a telepsychological visit, as it is an option for appointments to reduce exposure to COVID-19. Carolyn Shaw expressed understanding regarding the rationale for telepsychological services, and provided verbal consent for today's appointment. Prior to proceeding with today's appointment, Carolyn Shaw's physical location at the time of this appointment was obtained. In the event of technical difficulties, Gelinda shared a phone number she could be reached at. Carolyn Shaw and this provider participated in today's telepsychological service. Also, Carolyn Shaw denied anyone else being present in the room or on the WebEx appointment.  This provider conducted a brief check-in. Carolyn Shaw shared about recent events, including concern about the pandemic. She further shared she does not feel she is "severing [her] purpose" at work. Associated thoughts and feelings were processed. Carolyn Shaw shared she received a call from [facility], but she does not have an appointment scheduled yet. Regarding pleasurable activities, Carolyn Shaw shared making different things on her [employer]. She noted a plan to continue crafting with her [employer]. Positive reinforcement was provided. Additionally, Carolyn Shaw discussed recent eating habits, which included a couple instances of deviations from the meal plan. Since she is working from home, it was recommended she prepare her meals/snacks as if she is going to work. She agreed. Moreover, Carolyn Shaw  shared a reduction in emotional eating. Session then focused on mindfulness. Carolyn Shaw reported she enjoys doing a two minute exercise regularly. She described it helping her set the intention for her day. She also continues to engage in mindfulness walks. Positive reinforcement was provided. She was led through a mindfulness exercise focusing on the breath as an anchor. After the exercise, Carolyn Shaw noted, "That was pretty good" and discussed it being helpful with racing thoughts. Carolyn Shaw provided verbal consent during today's appointment for this provider to send the handout for today's exercise via e-mail. Termination planning was also discussed. She was receptive to scheduling a follow-up appointment in 3-4 weeks and an additional follow-up/termination appointment in 3-4 weeks after that. Overall, Carolyn Shaw was receptive to today's session as evidenced by openness to sharing, responsiveness to feedback, and willingness to continue engaging in mindfulness exercises.  Mental Status Examination:  Appearance: neat Behavior: cooperative Mood: euthymic Affect: mood congruent Speech: normal in rate, volume, and tone Eye Contact: appropriate Psychomotor Activity: appropriate Thought Process: linear, logical, and goal directed  Content/Perceptual Disturbances: no hallucinations, delusions, bizarre thinking or behavior reported or observed and no evidence of suicidal and homicidal ideation, plan, and intent Orientation: time, person, place and purpose of appointment Cognition/Sensorium: memory, attention, language, and fund of knowledge intact  Insight: good Judgment: good  Interventions:  Conducted a brief chart review Provided empathic reflections and validation Reviewed content from the previous session Engaged patient in a mindfulness exercise Employed motivational interviewing skills to assess patient's willingness/desire to adhere to recommended medical treatments and assignments Discussed termination  planning Provided positive reinforcement Employed supportive psychotherapy interventions to facilitate reduced distress, and to improve coping skills with identified stressors Employed acceptance and commitment interventions to emphasize mindfulness and acceptance without struggle  DSM-5 Diagnosis: 300.09 (F41.8)  Other Specified Anxiety Disorder, Emotional Eating Behaviors  Treatment Goal & Progress: During the initial appointment with this provider, the following treatment goal was established: decrease emotional eating. Carolyn Shaw has demonstrated progress in her goal as evidenced by increased awareness of hunger patterns and triggers for emotional eating. Carolyn Shaw also reported a reduction in emotional eating and continues to demonstrate willingness to engage in learned skill(s).  Plan: Carolyn Shaw continues to appear able and willing to participate as evidenced by engagement in reciprocal conversation, and asking questions for clarification as appropriate. The next appointment will be scheduled in three weeks, which will be via [employer]. The next session will focus on reviewing learned skills, and working towards the established treatment goal.

## 2019-07-22 NOTE — Progress Notes (Signed)
Virtual Visit via Video Note The purpose of this virtual visit is to provide medical care while limiting exposure to the novel coronavirus.    Consent was obtained for video visit:  Yes Answered questions that patient had about telehealth interaction:  Yes I discussed the limitations, risks, security and privacy concerns of performing an evaluation and management service by telemedicine. I also discussed with the patient that there may be a patient responsible charge related to this service. The patient expressed understanding and agreed to proceed.  Pt location: Home Physician Location: office Name of referring provider:  Donald Prose, MD I connected with Carolyn Shaw at patients initiation/request on 07/27/2019 at  8:30 AM EST by video enabled telemedicine application and verified that I am speaking with the correct person using two identifiers. Pt MRN:  [id number] Pt DOB:  09/17/1975 Video Participants:  Carolyn Shaw   History of Present Illness:  Carolyn Shaw is a 43 year old left-handed woman with migraines, uterine fibroids, allergic rhinitis and GERD who follows up for migraine.  UPDATE: Intensity:  5/10 Duration:  Up to 2 hours with Maxalt or Excedrin Frequency:  1 to 2 days a month Frequency of abortive medication:  1 to 2 days a month Rescue protocol:  Excedrin first line, Maxalt 2nd line. Current NSAIDS:  None Current analgesics:  Excedrin Current triptans: Maxalt 10 mg Current ergotamine: None Current anti-emetic: None Current muscle relaxants: None Current anti-anxiolytic: None Current sleep aide: None Current Antihypertensive medications: None Current Antidepressant medications: None Current Anticonvulsant medications: Topiramate 50 mg daily Current anti-CGRP: None Current Vitamins/Herbal/Supplements: None Current Antihistamines/Decongestants: None Other therapy: None Hormone/birth control: No  Caffeine:  Very little Diet: Hydrates (over 40  oz daily) Depression: No; Anxiety:  Yes Other pain: No Sleep hygiene: Okay  HISTORY: Onset:  Adolescence but formally diagnosed in 2002 Location:Left periorbital region Quality:Squeezing sensation of the eye followed by stabbing and pounding around the eye. Initial Intensity:7-8/10 Associated symptoms:photophobia, nausea, phonophobia.No osmophobia, eye lacrimation or nasal congestion. Aura:Visual field loss from the left moving to the right side leaving a left field cut briefly Initial Duration:All day without medication (1-3 hours with medication) Initial Frequency:Twice a week Activity:Lay down but able to force self to be active if needed Triggers/aggravating factors:  Bright lights, loud noise, menstrual cycle Relieving factors:Laying down.  Past NSAIDs: Ibuprofen, naproxen Past analgesics: Excedrin, Tylenol Past preventative therapy:none  Family history:No history of headache.  Past Medical History: Past Medical History:  Diagnosis Date  . Anxiety   . Back pain   . Chest pain   . Constipation   . Depression   . Fibroids   . Food allergy   . GERD (gastroesophageal reflux disease)   . Lactose intolerance   . Migraines   . Seasonal allergies   . Vitamin D deficiency     Medications: Outpatient Encounter Medications as of 07/27/2019  Medication Sig  . aspirin-acetaminophen-caffeine (EXCEDRIN MIGRAINE) [id number] MG per tablet Take by mouth every 6 (six) hours as needed for headache or migraine.  . famotidine (PEPCID) 20 MG tablet Take 1 tablet (20 mg total) by mouth 2 (two) times daily.  Marland Kitchen ibuprofen (ADVIL,MOTRIN) 800 MG tablet Take 800 mg by mouth every 8 (eight) hours as needed.  . Multiple Vitamin (MULTIVITAMIN) tablet Take 1 tablet by mouth daily.  . rizatriptan (MAXALT) 10 MG tablet Take 1 tablet (10 mg total) by mouth as needed for migraine. May repeat in 2 hours if needed  . sucralfate (CARAFATE) 1 g tablet  Take 1 tablet (1 g total)  by mouth 4 (four) times daily -  with meals and at bedtime.  . topiramate (TOPAMAX) 50 MG tablet Take 1 tablet (50 mg total) by mouth at bedtime.  . Vitamin D, Ergocalciferol, (DRISDOL) 1.25 MG (50000 UT) CAPS capsule Take 1 capsule (50,000 Units total) by mouth every 7 (seven) days.   No facility-administered encounter medications on file as of 07/27/2019.     Allergies: Allergies  Allergen Reactions  . Chocolate Hives  . Erythromycin Nausea And Vomiting    Family History: Family History  Problem Relation Age of Onset  . Diabetes Mother   . High blood pressure Mother   . Thyroid disease Mother   . Sleep apnea Mother   . Obesity Mother   . Diabetes Father   . High blood pressure Father   . Stroke Father   . Drug abuse Father   . Obesity Father   . Ataxia Neg Hx   . Chorea Neg Hx   . Dementia Neg Hx   . Mental retardation Neg Hx   . Migraines Neg Hx   . Multiple sclerosis Neg Hx   . Neurofibromatosis Neg Hx   . Neuropathy Neg Hx   . Parkinsonism Neg Hx   . Seizures Neg Hx     Social History: Social History   Socioeconomic History  . Marital status: Single    Spouse name: Not on file  . Number of children: Not on file  . Years of education: Not on file  . Highest education level: Not on file  Occupational History  . 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 Financial resource strain: Not on file  . Food insecurity    Worry: Not on file    Inability: Not on file  . Transportation needs    Medical: Not on file    Non-medical: Not on file  Tobacco Use  . Smoking status: Never Smoker  . Smokeless tobacco: Never Used  Substance and Sexual Activity  . Alcohol use: No    Alcohol/week: 0.0 standard drinks  . Drug use: No  . Sexual activity: Yes    Partners: Male  Lifestyle  . Physical activity    Days per week: Not on file    Minutes per session: Not on file  . Stress: Not on file  Relationships  . Social Herbalist on phone:  Not on file    Gets together: Not on file    Attends religious service: Not on file    Active member of club or organization: Not on file    Attends meetings of clubs or organizations: Not on file    Relationship status: Not on file  . Intimate partner violence    Fear of current or ex partner: Not on file    Emotionally abused: Not on file    Physically abused: Not on file    Forced sexual activity: Not on file  Other Topics Concern  . Not on file  Social History Narrative  . Not on file    Observations/Objective:   Height 5' 5.5" (1.664 m), weight 214 lb (97.1 kg). No acute distress.  Alert and oriented.  Speech fluent and not dysarthric.  Language intact.  Eyes orthophoric on primary gaze.  Face symmetric.  Assessment and Plan:   Migraine with aura, without status migrainosus, not intractable  1.  For preventative management, topiramate 50mg  at bedtime 2.  For abortive therapy, Excedrin  and/or Maxalt 3.  Limit use of pain relievers to no more than 2 days out of week to prevent risk of rebound or medication-overuse headache. 4.  Keep headache diary 5.  Exercise, hydration, caffeine cessation, sleep hygiene, monitor for and avoid triggers 6.  Consider:  magnesium citrate 400mg  daily, riboflavin 400mg  daily, and coenzyme Q10 100mg  three times daily 7. Follow up one year   Follow Up Instructions:    -I discussed the assessment and treatment plan with the patient. The patient was provided an opportunity to ask questions and all were answered. The patient agreed with the plan and demonstrated an understanding of the instructions.   The patient was advised to call back or seek an in-person evaluation if the symptoms worsen or if the condition fails to improve as anticipated.    Dudley Major, DO

## 2019-07-27 ENCOUNTER — Other Ambulatory Visit: Payer: Self-pay

## 2019-07-27 ENCOUNTER — Telehealth (INDEPENDENT_AMBULATORY_CARE_PROVIDER_SITE_OTHER): Payer: BC Managed Care – PPO | Admitting: Neurology

## 2019-07-27 ENCOUNTER — Encounter: Payer: Self-pay | Admitting: Neurology

## 2019-07-27 VITALS — Ht 65.5 in | Wt 214.0 lb

## 2019-07-27 DIAGNOSIS — G43109 Migraine with aura, not intractable, without status migrainosus: Secondary | ICD-10-CM

## 2019-07-27 MED ORDER — TOPIRAMATE 50 MG PO TABS: 50.0000 mg | ORAL_TABLET | Freq: Every day | ORAL | 3 refills | Status: DC

## 2019-07-28 ENCOUNTER — Ambulatory Visit (INDEPENDENT_AMBULATORY_CARE_PROVIDER_SITE_OTHER): Payer: BC Managed Care – PPO | Admitting: Psychology

## 2019-07-28 DIAGNOSIS — F418 Other specified anxiety disorders: Secondary | ICD-10-CM | POA: Diagnosis not present

## 2019-07-30 ENCOUNTER — Other Ambulatory Visit: Payer: Self-pay

## 2019-07-30 ENCOUNTER — Encounter (INDEPENDENT_AMBULATORY_CARE_PROVIDER_SITE_OTHER): Payer: Self-pay | Admitting: Bariatrics

## 2019-07-30 ENCOUNTER — Ambulatory Visit (INDEPENDENT_AMBULATORY_CARE_PROVIDER_SITE_OTHER): Payer: BC Managed Care – PPO | Admitting: Bariatrics

## 2019-07-30 VITALS — BP 96/65 | HR 69 | Temp 98.2°F | Ht 65.0 in | Wt 213.0 lb

## 2019-07-30 DIAGNOSIS — Z6835 Body mass index (BMI) 35.0-35.9, adult: Secondary | ICD-10-CM

## 2019-07-30 DIAGNOSIS — E559 Vitamin D deficiency, unspecified: Secondary | ICD-10-CM | POA: Diagnosis not present

## 2019-07-30 DIAGNOSIS — F3289 Other specified depressive episodes: Secondary | ICD-10-CM

## 2019-07-31 ENCOUNTER — Ambulatory Visit: Payer: BC Managed Care – PPO | Admitting: Neurology

## 2019-08-03 ENCOUNTER — Encounter (INDEPENDENT_AMBULATORY_CARE_PROVIDER_SITE_OTHER): Payer: Self-pay | Admitting: Bariatrics

## 2019-08-03 NOTE — Progress Notes (Signed)
Office: [phone]  /  Fax: [phone]   HPI:   Chief Complaint: OBESITY Carolyn Shaw is here to discuss her progress with her obesity treatment plan. She is on the Category 3 plan and is following her eating plan approximately 50% of the time. She states she is exercising 0 minutes 0 times per week. Carolyn Shaw is down 1 lb. She states that she ate "wrong" during the Thanksgiving holiday. She reports eating adequate protein.  Her weight is 213 lb (96.6 kg) today and has had a weight loss of 1 pound over a period of 5 weeks since her last visit. She has lost 6 lbs since starting treatment with [place].  Vitamin D deficiency Carolyn Shaw has a diagnosis of Vitamin D deficiency. She is currently taking Vit D and denies nausea, vomiting or muscle weakness.  Depression with emotional eating behaviors Carolyn Shaw is struggling with emotional eating and using food for comfort to the extent that it is negatively impacting her health. She often snacks when she is not hungry. Carolyn Shaw sometimes feels she is out of control and then feels guilty that she made poor food choices. She has been working on behavior modification techniques to help reduce her emotional eating and has been somewhat successful. Carolyn Shaw has seen Dr. Mallie Mussel, our bariatric psychologist. She shows no sign of suicidal or homicidal ideations.  Depression screen PHQ 2/9 04/06/2019  Decreased Interest 0  Down, Depressed, Hopeless 1  PHQ - 2 Score 1  Altered sleeping 0  Tired, decreased energy 1  Change in appetite 2  Feeling bad or failure about yourself  1  Trouble concentrating 1  Moving slowly or fidgety/restless 0  Suicidal thoughts 0  PHQ-9 Score 6  Difficult doing work/chores Not difficult at all   ASSESSMENT AND PLAN:  Vitamin D deficiency  Other depression, emotional eating  Class 2 severe obesity with serious comorbidity and body mass index (BMI) of 35.0 to 35.9 in adult, unspecified obesity type (Carolyn Shaw)  PLAN:  Vitamin D Deficiency Carolyn Shaw  was informed that low Vitamin D levels contributes to fatigue and are associated with obesity, breast, and colon cancer. She agrees to continue taking Vit D and will follow-up for routine testing of Vitamin D, at least 2-3 times per year. She was informed of the risk of over-replacement of Vitamin D and agrees to not increase her dose unless she discusses this with [place] first. Carolyn Shaw agrees to follow-up with our clinic in 2-3 weeks.  Emotional Eating Behaviors (other depression) We discussed CBT techniques today to help Carolyn Shaw deal with her emotional eating behaviors. Carolyn Shaw will continue follow-up with Dr. Mallie Mussel as scheduled.  I spent > than 50% of the 20 minute visit on counseling as documented in the note.  Obesity Carolyn Shaw is currently in the action stage of change. As such, her goal is to continue with weight loss efforts. She has agreed to follow the Category 3 plan. Carolyn Shaw will work on meal planning and intentional eating. Carolyn Shaw has been instructed to be more consistent with her exercise (cardiac and resistance) for weight loss and overall health benefits. We discussed the following Behavioral Modification Strategies today: increasing lean protein intake, decreasing simple carbohydrates, increasing vegetables, increase H20 intake, decrease eating out, no skipping meals, work on meal planning and easy cooking plans, keeping healthy foods in the home, and planning for success.  Carolyn Shaw has agreed to follow-up with our clinic in 2-3 weeks. She was informed of the importance of frequent follow-up visits to maximize her success with intensive  lifestyle modifications for her multiple health conditions.  ALLERGIES: Allergies  Allergen Reactions  . Chocolate Hives  . Erythromycin Nausea And Vomiting    MEDICATIONS: Current Outpatient Medications on File Prior to Visit  Medication Sig Dispense Refill  . aspirin-acetaminophen-caffeine (EXCEDRIN MIGRAINE) 250-250-65 MG per tablet Take by mouth every 6  (six) hours as needed for headache or migraine.    . famotidine (PEPCID) 20 MG tablet Take 1 tablet (20 mg total) by mouth 2 (two) times daily. 30 tablet 0  . ibuprofen (ADVIL,MOTRIN) 800 MG tablet Take 800 mg by mouth every 8 (eight) hours as needed.    . Multiple Vitamin (MULTIVITAMIN) tablet Take 1 tablet by mouth daily.    . rizatriptan (MAXALT) 10 MG tablet Take 1 tablet (10 mg total) by mouth as needed for migraine. May repeat in 2 hours if needed 30 tablet 3  . sucralfate (CARAFATE) 1 g tablet Take 1 tablet (1 g total) by mouth 4 (four) times daily -  with meals and at bedtime. 60 tablet 1  . topiramate (TOPAMAX) 50 MG tablet Take 1 tablet (50 mg total) by mouth at bedtime. 90 tablet 3  . Vitamin D, Ergocalciferol, (DRISDOL) 1.25 MG (50000 UT) CAPS capsule Take 1 capsule (50,000 Units total) by mouth every 7 (seven) days. 4 capsule 0   No current facility-administered medications on file prior to visit.     PAST MEDICAL HISTORY: Past Medical History:  Diagnosis Date  . Anxiety   . Back pain   . Chest pain   . Constipation   . Depression   . Fibroids   . Food allergy   . GERD (gastroesophageal reflux disease)   . Lactose intolerance   . Migraines   . Seasonal allergies   . Vitamin D deficiency     PAST SURGICAL HISTORY: History reviewed. No pertinent surgical history.  SOCIAL HISTORY: Social History   Tobacco Use  . Smoking status: Never Smoker  . Smokeless tobacco: Never Used  Substance Use Topics  . Alcohol use: No    Alcohol/week: 0.0 standard drinks  . Drug use: No    FAMILY HISTORY: Family History  Problem Relation Age of Onset  . Diabetes Mother   . High blood pressure Mother   . Thyroid disease Mother   . Sleep apnea Mother   . Obesity Mother   . Diabetes Father   . High blood pressure Father   . Stroke Father   . Drug abuse Father   . Obesity Father   . Ataxia Neg Hx   . Chorea Neg Hx   . Dementia Neg Hx   . Mental retardation Neg Hx   .  Migraines Neg Hx   . Multiple sclerosis Neg Hx   . Neurofibromatosis Neg Hx   . Neuropathy Neg Hx   . Parkinsonism Neg Hx   . Seizures Neg Hx    ROS: Review of Systems  Gastrointestinal: Negative for nausea and vomiting.  Musculoskeletal:       Negative for muscle weakness.  Psychiatric/Behavioral: Positive for depression (emotional eating). Negative for suicidal ideas.       Negative for homicidal ideas.   PHYSICAL EXAM: Blood pressure 96/65, pulse 69, temperature 98.2 F (36.8 C), temperature source Oral, height 5\' 5"  (1.651 m), weight 213 lb (96.6 kg), SpO2 100 %. Body mass index is 35.45 kg/m. Physical Exam Vitals signs reviewed.  Constitutional:      Appearance: Normal appearance. She is obese.  Cardiovascular:  Rate and Rhythm: Normal rate.     Pulses: Normal pulses.  Pulmonary:     Effort: Pulmonary effort is normal.     Breath sounds: Normal breath sounds.  Musculoskeletal: Normal range of motion.  Skin:    General: Skin is warm and dry.  Neurological:     Mental Status: She is alert and oriented to person, place, and time.  Psychiatric:        Behavior: Behavior normal.   RECENT LABS AND TESTS: BMET    Component Value Date/Time   NA 136 03/08/2019 1721   K 3.6 03/08/2019 1721   CL 100 03/08/2019 1721   CO2 25 03/08/2019 1721   GLUCOSE 93 03/08/2019 1721   BUN 11 03/08/2019 1721   CREATININE 0.75 03/08/2019 1721   CALCIUM 9.2 03/08/2019 1721   GFRNONAA >60 03/08/2019 1721   GFRAA >60 03/08/2019 1721   Lab Results  Component Value Date   HGBA1C 5.1 04/06/2019   Lab Results  Component Value Date   INSULIN 8.3 04/06/2019   CBC    Component Value Date/Time   WBC 8.8 03/08/2019 1721   RBC 5.38 (H) 03/08/2019 1721   HGB 16.3 (H) 03/08/2019 1721   HCT 45.4 03/08/2019 1721   PLT 283 03/08/2019 1721   MCV 84.4 03/08/2019 1721   MCH 30.3 03/08/2019 1721   MCHC 35.9 03/08/2019 1721   RDW 12.5 03/08/2019 1721   Iron/TIBC/Ferritin/ %Sat No  results found for: IRON, TIBC, FERRITIN, IRONPCTSAT Lipid Panel  No results found for: CHOL, TRIG, HDL, CHOLHDL, VLDL, LDLCALC, LDLDIRECT Hepatic Function Panel     Component Value Date/Time   PROT 7.4 03/08/2019 1721   ALBUMIN 4.1 03/08/2019 1721   AST 18 03/08/2019 1721   ALT 14 03/08/2019 1721   ALKPHOS 41 03/08/2019 1721   BILITOT 0.9 03/08/2019 1721      Component Value Date/Time   TSH 5.170 (H) 04/06/2019 1013   Results for Carolyn Shaw, Carolyn Shaw (MRN [id number]) as of 08/03/2019 10:28  Ref. Range 04/06/2019 10:13  Vitamin D, 25-Hydroxy Latest Ref Range: 30.0 - 100.0 ng/mL 30.5   OBESITY BEHAVIORAL INTERVENTION VISIT  Today's visit was #7  Starting weight: 219 lbs Starting date: 04/06/2019 Today's weight: 213 lbs   Today's date: 07/30/2019 Total lbs lost to date: 6     07/30/2019  Height 5\' 5"  (1.651 m)  Weight 213 lb (96.6 kg)  BMI (Calculated) 35.44  BLOOD PRESSURE - SYSTOLIC 96  BLOOD PRESSURE - DIASTOLIC 65   Body Fat % 123XX123 %  Total Body Water (lbs) 84 lbs   ASK: We discussed the diagnosis of obesity with Carolyn Shaw today and Carolyn Shaw agreed to give [place] permission to discuss obesity behavioral modification therapy today.  ASSESS: Carolyn Shaw has the diagnosis of obesity and her BMI today is 35.4. Carolyn Shaw is in the action stage of change.   ADVISE: Carolyn Shaw was educated on the multiple health risks of obesity as well as the benefit of weight loss to improve her health. She was advised of the need for long term treatment and the importance of lifestyle modifications to improve her current health and to decrease her risk of future health problems.  AGREE: Multiple dietary modification options and treatment options were discussed and  Arlyn agreed to follow the recommendations documented in the above note.  ARRANGE: Nakiyah was educated on the importance of frequent visits to treat obesity as outlined per CMS and USPSTF guidelines and agreed to schedule her next follow  up appointment today.  Migdalia Dk, am acting as Location manager for [employer], DO  I have reviewed the above documentation for accuracy and completeness, and I agree with the above. -Carolyn Lesch, DO

## 2019-08-04 NOTE — Progress Notes (Signed)
Office: [phone]  /  Fax: [phone]    Date: August 17, 2019   Appointment Start Time: 4:00pm Duration: 32 minutes Provider: Glennie Isle, Psy.D. Type of Session: Individual Therapy  Location of Patient: Home Location of Provider: [facility] Type of Contact: Telepsychological Visit via [employer]  Session Content: Carolyn Shaw is a 43 y.o. female presenting via [place] for a follow-up appointment to address the previously established treatment goal of decreasing emotional eating. Today's appointment was a telepsychological visit due to COVID-19. Carolyn Shaw provided verbal consent for today's telepsychological appointment and she is aware she is responsible for securing confidentiality on her end of the session. Prior to proceeding with today's appointment, Carolyn Shaw's physical location at the time of this appointment was obtained as well a phone number she could be reached at in the event of technical difficulties. Carolyn Shaw and this provider participated in today's telepsychological service.   This provider conducted a brief check-in. Carolyn Shaw shared about recent events, including work related stressors. She also shared about plans for her time off from work. In addition, Carolyn Shaw reported she continues to engage in pleasurable activities and described it as relaxing. Positive reinforcement was provided. Regarding eating, Carolyn Shaw reported an instance of emotional eating, but described an overall reduction. Session then focused on establishing boundaries as she discussed being frequently asked to do tasks, which has contributed to overall stress. She stated she has started setting boundaries to ensure she is able to focus on self-care; positive reinforcement was provided. Remainder of today's appointment focused on mindfulness. Carolyn Shaw discussed her thoughts continue to wander especially at night. Thus, she was engaged in an exercise, "Leaves on a Stream." Her experience after was processed.  Carolyn Shaw provided verbal consent during today's appointment for this provider to send the handout about today's exercise via e-mail. Carolyn Shaw was receptive to today's appointment as evidenced by openness to sharing, responsiveness to feedback, and willingness to engage in learned skills.  Mental Status Examination:  Appearance: well groomed and appropriate hygiene  Behavior: appropriate to circumstances Mood: euthymic Affect: mood congruent Speech: normal in rate, volume, and tone Eye Contact: appropriate Psychomotor Activity: appropriate Gait: unable to assess Thought Process: linear, logical, and goal directed  Thought Content/Perception: no hallucinations, delusions, bizarre thinking or behavior reported or observed and no evidence of suicidal and homicidal ideation, plan, and intent Orientation: time, person, place and purpose of appointment Memory/Concentration: memory, attention, language, and fund of knowledge intact  Insight/Judgment: good  Interventions:  Conducted a brief chart review Provided empathic reflections and validation Provided positive reinforcement Employed supportive psychotherapy interventions to facilitate reduced distress and to improve coping skills with identified stressors Employed motivational interviewing skills to assess patient's willingness/desire to adhere to recommended medical treatments and assignments Engaged patient in mindfulness exercise(s) Employed acceptance and commitment interventions to emphasize mindfulness and acceptance without struggle  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 Carolyn Shaw has demonstrated progress in her goal as evidenced by increased awareness of hunger patterns, increased awareness of triggers for emotional eating and reduction in  emotional eating. Carolyn Shaw also continues to demonstrate willingness to engage in learned skill(s).  Plan: The next appointment will be scheduled in one month, which will be via [employer]. The next session will focus on reviewing learned skills and termination. Additionally, Carolyn Shaw reported she has an appointment scheduled with [facility] on  September 17, 2019 at 1:30pm.

## 2019-08-17 ENCOUNTER — Ambulatory Visit (INDEPENDENT_AMBULATORY_CARE_PROVIDER_SITE_OTHER): Payer: BC Managed Care – PPO | Admitting: Psychology

## 2019-08-17 ENCOUNTER — Other Ambulatory Visit: Payer: Self-pay

## 2019-08-17 DIAGNOSIS — F418 Other specified anxiety disorders: Secondary | ICD-10-CM

## 2019-08-19 ENCOUNTER — Ambulatory Visit (INDEPENDENT_AMBULATORY_CARE_PROVIDER_SITE_OTHER): Payer: BC Managed Care – PPO | Admitting: Bariatrics

## 2019-08-19 ENCOUNTER — Encounter (INDEPENDENT_AMBULATORY_CARE_PROVIDER_SITE_OTHER): Payer: Self-pay | Admitting: Bariatrics

## 2019-08-19 ENCOUNTER — Other Ambulatory Visit: Payer: Self-pay

## 2019-08-19 VITALS — BP 109/75 | HR 99 | Temp 98.6°F | Ht 65.0 in | Wt 210.0 lb

## 2019-08-19 DIAGNOSIS — Z6835 Body mass index (BMI) 35.0-35.9, adult: Secondary | ICD-10-CM

## 2019-08-19 DIAGNOSIS — Z9189 Other specified personal risk factors, not elsewhere classified: Secondary | ICD-10-CM | POA: Diagnosis not present

## 2019-08-19 DIAGNOSIS — G43109 Migraine with aura, not intractable, without status migrainosus: Secondary | ICD-10-CM | POA: Diagnosis not present

## 2019-08-19 DIAGNOSIS — E559 Vitamin D deficiency, unspecified: Secondary | ICD-10-CM

## 2019-08-19 MED ORDER — VITAMIN D (ERGOCALCIFEROL) 1.25 MG (50000 UNIT) PO CAPS: 50000.0000 [IU] | ORAL_CAPSULE | ORAL | 0 refills | Status: DC

## 2019-08-19 NOTE — Progress Notes (Signed)
Office: [phone]  /  Fax: [phone]   HPI:  Chief Complaint: OBESITY Carolyn Shaw is here to discuss her progress with her obesity treatment plan. She is on the Category 3 plan and states she is following her eating plan approximately 70-75% of the time. She states she is walking 60 minutes 5 times per week.  Carolyn Shaw is down 3 lbs. She states she has been more tired.  Today's visit was #8 Starting weight: 219 lbs Starting date: 04/06/2019 Today's weight: 210 lbs Today's date: 08/19/2019 Total lbs lost to date: 9  Total lbs lost since last in-office visit: 3   Vitamin D deficiency Carolyn Shaw has a diagnosis of Vitamin D deficiency and is taking prescription Vitamin D. No nausea, vomiting, or muscle weakness.  At risk for osteopenia and osteoporosis Carolyn Shaw is at higher risk of osteopenia and osteoporosis due to Vitamin D deficiency.   Migraine with Carolyn Shaw has migraine headaches with aura, which are triggered by dehydration. Last migraine was before her cycle. Migraines are less frequent.  ASSESSMENT AND PLAN:  Vitamin D deficiency - Plan: Vitamin D, Ergocalciferol, (DRISDOL) 1.25 MG (50000 UT) CAPS capsule  Migraine with aura and without status migrainosus, not intractable  At risk for osteoporosis  Class 2 severe obesity with serious comorbidity and body mass index (BMI) of 35.0 to 35.9 in adult, unspecified obesity type ([place])  PLAN:  Vitamin D Deficiency Marelyn was informed that low Vitamin D levels contributes to fatigue and are associated with obesity, breast, and colon cancer. She agrees to continue to take prescription Vit D @ 50,000 IU every week #4 with 0 refills and will follow-up for routine testing of Vitamin D, at least 2-3 times per year. She was informed of the risk of over-replacement of Vitamin D and agrees to not increase her dose unless she discusses this with [place] first. Nekeshia agrees to follow-up with our clinic in 2-3 weeks.  At risk for osteopenia and  osteoporosis Carolyn Shaw was given extended  (15 minutes) osteoporosis prevention counseling today. Carolyn Shaw is at risk for osteopenia and osteoporosis due to her Vitamin D deficiency. She was encouraged to take her Vitamin D and follow her higher calcium diet and increase strengthening exercise to help strengthen her bones and decrease her risk of osteopenia and osteoporosis.  Migraine with Carolyn Shaw will follow-up with her neurologist. She will increase her water intake for hydration. We discussed sleep hygiene.  Obesity Carolyn Shaw is currently in the action stage of change. As such, her goal is to continue with weight loss efforts. She has agreed to follow the Category 3 plan. Carolyn Shaw will work on meal planning and intentional eating. Carolyn Shaw has been instructed to increase activity and continue strength training and watching [employer] videos for weight loss and overall health benefits. We discussed the following Behavioral Modification Strategies today: increasing lean protein intake, decreasing simple carbohydrates, increasing vegetables, increase H20 intake, decrease eating out, no skipping meals, work on meal planning and easy cooking plans, keeping healthy foods in the home, and planning for success.  Carolyn Shaw has agreed to follow-up with our clinic in 2-3 weeks. She was informed of the importance of frequent follow-up visits to maximize her success with intensive lifestyle modifications for her multiple health conditions.  ALLERGIES: Allergies  Allergen Reactions  . Chocolate Hives  . Erythromycin Nausea And Vomiting    MEDICATIONS: Current Outpatient Medications on File Prior to Visit  Medication Sig Dispense Refill  . aspirin-acetaminophen-caffeine (EXCEDRIN MIGRAINE) 250-250-65 MG per tablet Take by  mouth every 6 (six) hours as needed for headache or migraine.    . famotidine (PEPCID) 20 MG tablet Take 1 tablet (20 mg total) by mouth 2 (two) times daily. 30 tablet 0  . ibuprofen (ADVIL,MOTRIN) 800  MG tablet Take 800 mg by mouth every 8 (eight) hours as needed.    . Multiple Vitamin (MULTIVITAMIN) tablet Take 1 tablet by mouth daily.    . rizatriptan (MAXALT) 10 MG tablet Take 1 tablet (10 mg total) by mouth as needed for migraine. May repeat in 2 hours if needed 30 tablet 3  . sucralfate (CARAFATE) 1 g tablet Take 1 tablet (1 g total) by mouth 4 (four) times daily -  with meals and at bedtime. 60 tablet 1  . topiramate (TOPAMAX) 50 MG tablet Take 1 tablet (50 mg total) by mouth at bedtime. 90 tablet 3   No current facility-administered medications on file prior to visit.    PAST MEDICAL HISTORY: Past Medical History:  Diagnosis Date  . Anxiety   . Back pain   . Chest pain   . Constipation   . Depression   . Fibroids   . Food allergy   . GERD (gastroesophageal reflux disease)   . Lactose intolerance   . Migraines   . Seasonal allergies   . Vitamin D deficiency     PAST SURGICAL HISTORY: History reviewed. No pertinent surgical history.  SOCIAL HISTORY: Social History   Tobacco Use  . Smoking status: Never Smoker  . Smokeless tobacco: Never Used  Substance Use Topics  . Alcohol use: No    Alcohol/week: 0.0 standard drinks  . Drug use: No    FAMILY HISTORY: Family History  Problem Relation Age of Onset  . Diabetes Mother   . High blood pressure Mother   . Thyroid disease Mother   . Sleep apnea Mother   . Obesity Mother   . Diabetes Father   . High blood pressure Father   . Stroke Father   . Drug abuse Father   . Obesity Father   . Ataxia Neg Hx   . Chorea Neg Hx   . Dementia Neg Hx   . Mental retardation Neg Hx   . Migraines Neg Hx   . Multiple sclerosis Neg Hx   . Neurofibromatosis Neg Hx   . Neuropathy Neg Hx   . Parkinsonism Neg Hx   . Seizures Neg Hx    ROS: Review of Systems  Constitutional: Positive for weight loss.  Gastrointestinal: Negative for nausea and vomiting.  Musculoskeletal:       Negative for muscle weakness.  Neurological:  Positive for headaches (migraines with aura).   PHYSICAL EXAM: Blood pressure 109/75, pulse 99, temperature 98.6 F (37 C), height 5\' 5"  (1.651 m), weight 210 lb (95.3 kg), last menstrual period 08/11/2019, SpO2 98 %. Body mass index is 34.95 kg/m. Physical Exam Vitals reviewed.  Constitutional:      Appearance: Normal appearance. She is obese.  Cardiovascular:     Rate and Rhythm: Normal rate.     Pulses: Normal pulses.  Pulmonary:     Effort: Pulmonary effort is normal.     Breath sounds: Normal breath sounds.  Musculoskeletal:        General: Normal range of motion.  Skin:    General: Skin is warm and dry.  Neurological:     Mental Status: She is alert and oriented to person, place, and time.  Psychiatric:        Behavior:  Behavior normal.   RECENT LABS AND TESTS: BMET    Component Value Date/Time   NA 136 03/08/2019 1721   K 3.6 03/08/2019 1721   CL 100 03/08/2019 1721   CO2 25 03/08/2019 1721   GLUCOSE 93 03/08/2019 1721   BUN 11 03/08/2019 1721   CREATININE 0.75 03/08/2019 1721   CALCIUM 9.2 03/08/2019 1721   GFRNONAA >60 03/08/2019 1721   GFRAA >60 03/08/2019 1721   Lab Results  Component Value Date   HGBA1C 5.1 04/06/2019   Lab Results  Component Value Date   INSULIN 8.3 04/06/2019   CBC    Component Value Date/Time   WBC 8.8 03/08/2019 1721   RBC 5.38 (H) 03/08/2019 1721   HGB 16.3 (H) 03/08/2019 1721   HCT 45.4 03/08/2019 1721   PLT 283 03/08/2019 1721   MCV 84.4 03/08/2019 1721   MCH 30.3 03/08/2019 1721   MCHC 35.9 03/08/2019 1721   RDW 12.5 03/08/2019 1721   Iron/TIBC/Ferritin/ %Sat No results found for: IRON, TIBC, FERRITIN, IRONPCTSAT Lipid Panel  No results found for: CHOL, TRIG, HDL, CHOLHDL, VLDL, LDLCALC, LDLDIRECT Hepatic Function Panel     Component Value Date/Time   PROT 7.4 03/08/2019 1721   ALBUMIN 4.1 03/08/2019 1721   AST 18 03/08/2019 1721   ALT 14 03/08/2019 1721   ALKPHOS 41 03/08/2019 1721   BILITOT 0.9 03/08/2019  1721      Component Value Date/Time   TSH 5.170 (H) 04/06/2019 1013    I, Michaelene Song, am acting as Location manager for [employer], DO  I have reviewed the above documentation for accuracy and completeness, and I agree with the above. Jearld Lesch, DO

## 2019-09-01 NOTE — Progress Notes (Signed)
  Office: [phone]  /  Fax: [phone]    Date: September 14, 2019   Appointment Start Time: 3:31pm Duration: 36 minutes Provider: Glennie Isle, Psy.D. Type of Session: Individual Therapy  Location of Patient: Home Location of Provider: [facility] Type of Contact: Telepsychological Visit via [employer] Web[employer]  Session Content: Carolyn Shaw is a 44 y.o. female presenting via [facility] for a follow-up appointment to address the previously established treatment goal of decreasing emotional eating. Today's appointment was a telepsychological visit due to COVID-19. Carolyn Shaw provided verbal consent for today's telepsychological appointment and she is aware she is responsible for securing confidentiality on her end of the session. Prior to proceeding with today's appointment, Carolyn Shaw's physical location at the time of this appointment was obtained as well a phone number she could be reached at in the event of technical difficulties. Carolyn Shaw and this provider participated in today's telepsychological service.   This provider conducted a brief check-in. Carolyn Shaw shared about recent events. She shared she continues to engage in pleasurable activities. Regarding eating, Carolyn Shaw acknowledged deviations during the holidays; however, she indicated making better choices and engaging in portion control. She continues to report a reduction in emotional eating. A plan was developed to help Carolyn Shaw cope with emotional eating using learned skills. She wrote down the following plan: focus on hydration, be prepared with snacks congruent to the meal plan, pause to ask questions when triggered to eat (e.g., Am I really hungry?; Is there something bothering me? Will I feel better if I eat?), and engage in coping skills after going through the aforementioned questions. In addition, this provider shared about apps for mindfulness, as Carolyn Shaw discussed enjoying a free trial for an app she found. Overall, Carolyn Shaw was receptive to  today's appointment as evidenced by openness to sharing, responsiveness to feedback, and willingness to engage in learned skills.  Mental Status Examination:  Appearance: well groomed and appropriate hygiene  Behavior: appropriate to circumstances Mood: euthymic Affect: mood congruent Speech: normal in rate, volume, and tone Eye Contact: appropriate Psychomotor Activity: appropriate Gait: unable to assess Thought Process: linear, logical, and goal directed  Thought Content/Perception: no hallucinations, delusions, bizarre thinking or behavior reported or observed and no evidence of suicidal and homicidal ideation, plan, and intent Orientation: time, person, place and purpose of appointment Memory/Concentration: memory, attention, language, and fund of knowledge intact  Insight/Judgment: good  Interventions:  Conducted a brief chart review Provided empathic reflections and validation Employed motivational interviewing skills to assess patient's willingness/desire to adhere to recommended medical treatments and assignments Reviewed learned skills  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 Carolyn Shaw demonstrated progress in her goal as evidenced by increased awareness of hunger patterns, increased awareness of triggers for emotional eating and reduction in emotional eating. Carolyn Shaw also continues to demonstrate willingness to engage in learned skill(s).  Plan: As previously planned, today was Carolyn Shaw's last appointment with this provider. She noted she will be initiating therapeutic services with [facility] on Thursday, September 17, 2019. She acknowledged understanding that she may request a follow-up appointment with this provider in the future as long as she is still established with the  clinic. No further follow-up planned by this provider.

## 2019-09-10 ENCOUNTER — Encounter (INDEPENDENT_AMBULATORY_CARE_PROVIDER_SITE_OTHER): Payer: Self-pay | Admitting: Bariatrics

## 2019-09-10 ENCOUNTER — Ambulatory Visit (INDEPENDENT_AMBULATORY_CARE_PROVIDER_SITE_OTHER): Payer: BC Managed Care – PPO | Admitting: Bariatrics

## 2019-09-10 ENCOUNTER — Other Ambulatory Visit: Payer: Self-pay

## 2019-09-10 VITALS — BP 116/76 | HR 71 | Temp 98.7°F | Ht 65.0 in | Wt 210.0 lb

## 2019-09-10 DIAGNOSIS — E559 Vitamin D deficiency, unspecified: Secondary | ICD-10-CM | POA: Diagnosis not present

## 2019-09-10 DIAGNOSIS — G43109 Migraine with aura, not intractable, without status migrainosus: Secondary | ICD-10-CM

## 2019-09-10 DIAGNOSIS — Z6835 Body mass index (BMI) 35.0-35.9, adult: Secondary | ICD-10-CM | POA: Diagnosis not present

## 2019-09-14 ENCOUNTER — Other Ambulatory Visit: Payer: Self-pay

## 2019-09-14 ENCOUNTER — Ambulatory Visit (INDEPENDENT_AMBULATORY_CARE_PROVIDER_SITE_OTHER): Payer: BC Managed Care – PPO | Admitting: Psychology

## 2019-09-14 DIAGNOSIS — F418 Other specified anxiety disorders: Secondary | ICD-10-CM | POA: Diagnosis not present

## 2019-09-14 NOTE — Progress Notes (Signed)
Chief Complaint:   OBESITY Carolyn Shaw is here to discuss her progress with her obesity treatment plan along with follow-up of her obesity related diagnoses. Carolyn Shaw is on the Category 3 Plan and states she is following her eating plan approximately 60% of the time. Carolyn Shaw states she is walking 3,000-4,000 steps 5 times per week.  Today's visit was #: 9 Starting weight: 219 lbs Starting date: 04/06/2019 Today's weight: 210 lbs Today's date: 09/10/2019 Total lbs lost to date: 9  Total lbs lost since last in-office visit: 0  Interim History: Carolyn Shaw weight remains the same. She is not exercising as much and is struggling with variety.  Subjective:   Vitamin D deficiency. No nausea, vomiting, or muscle weakness. Last Vitamin D level 30.5 on 04/06/2019.   Migraine with aura and without status migrainosus, not intractable. Carolyn Shaw is taking Maxalt and Topamax and reports decreased migraines.  Assessment/Plan:   Vitamin D deficiency. Low Vitamin D level contributes to fatigue and are associated with obesity, breast, and colon cancer. She agrees to continue taking Vitamin D and will follow-up for routine testing of Vitamin D, at least 2-3 times per year to avoid over-replacement.  Migraine with aura and without status migrainosus, not intractable. Carolyn Shaw will continue her medications. We discussed that weight loss can be beneficial for migraines.  Class 2 severe obesity with serious comorbidity and body mass index (BMI) of 35.0 to 35.9 in adult, unspecified obesity type ([place]).  Carolyn Shaw is currently in the action stage of change. As such, her goal is to continue with weight loss efforts. She has agreed to the Category 3 Plan and journal 450-600 calories and 40+ grams of protein at supper.  She will work on meal planning, intentional eating, and will continue to read labels.  Exercise goals: Carolyn Shaw will continue exercise as above (dance and U-tube).  Behavioral modification  strategies: increasing lean protein intake, decreasing simple carbohydrates, increasing vegetables, increasing water intake, decreasing eating out, no skipping meals, meal planning and cooking strategies, keeping healthy foods in the home and planning for success.  Carolyn Shaw has agreed to follow-up with our clinic in 2 weeks. She was informed of the importance of frequent follow-up visits to maximize her success with intensive lifestyle modifications for her multiple health conditions.   Objective:   Blood pressure 116/76, pulse 71, temperature 98.7 F (37.1 C), height 5\' 5"  (1.651 m), weight 210 lb (95.3 kg), last menstrual period 09/06/2019, SpO2 98 %. Body mass index is 34.95 kg/m.  General: Cooperative, alert, well developed, in no acute distress. HEENT: Conjunctivae and lids unremarkable. Cardiovascular: Regular rhythm.  Lungs: Normal work of breathing. Neurologic: No focal deficits.   Lab Results  Component Value Date   CREATININE 0.75 03/08/2019   BUN 11 03/08/2019   NA 136 03/08/2019   K 3.6 03/08/2019   CL 100 03/08/2019   CO2 25 03/08/2019   Lab Results  Component Value Date   ALT 14 03/08/2019   AST 18 03/08/2019   ALKPHOS 41 03/08/2019   BILITOT 0.9 03/08/2019   Lab Results  Component Value Date   HGBA1C 5.1 04/06/2019   Lab Results  Component Value Date   INSULIN 8.3 04/06/2019   Lab Results  Component Value Date   TSH 5.170 (H) 04/06/2019   No results found for: CHOL, HDL, LDLCALC, LDLDIRECT, TRIG, CHOLHDL Lab Results  Component Value Date   WBC 8.8 03/08/2019   HGB 16.3 (H) 03/08/2019   HCT 45.4 03/08/2019  MCV 84.4 03/08/2019   PLT 283 03/08/2019   No results found for: IRON, TIBC, FERRITIN  Attestation Statements:   Reviewed by clinician on day of visit: allergies, medications, problem list, medical history, surgical history, family history, social history, and previous encounter notes.  Time spent on visit including pre-visit chart review and  post-visit care was 20 minutes.   Migdalia Dk, am acting as Location manager for [employer], DO   I have reviewed the above documentation for accuracy and completeness, and I agree with the above. Jearld Lesch, DO

## 2019-09-15 ENCOUNTER — Encounter (INDEPENDENT_AMBULATORY_CARE_PROVIDER_SITE_OTHER): Payer: Self-pay | Admitting: Bariatrics

## 2019-10-01 ENCOUNTER — Ambulatory Visit (INDEPENDENT_AMBULATORY_CARE_PROVIDER_SITE_OTHER): Payer: BC Managed Care – PPO | Admitting: Bariatrics

## 2019-10-08 ENCOUNTER — Encounter (INDEPENDENT_AMBULATORY_CARE_PROVIDER_SITE_OTHER): Payer: Self-pay | Admitting: Bariatrics

## 2019-10-08 ENCOUNTER — Other Ambulatory Visit: Payer: Self-pay

## 2019-10-08 ENCOUNTER — Ambulatory Visit (INDEPENDENT_AMBULATORY_CARE_PROVIDER_SITE_OTHER): Payer: BC Managed Care – PPO | Admitting: Bariatrics

## 2019-10-08 VITALS — BP 124/84 | HR 83 | Temp 99.0°F | Ht 65.0 in | Wt 210.0 lb

## 2019-10-08 DIAGNOSIS — E6609 Other obesity due to excess calories: Secondary | ICD-10-CM

## 2019-10-08 DIAGNOSIS — Z6835 Body mass index (BMI) 35.0-35.9, adult: Secondary | ICD-10-CM

## 2019-10-08 DIAGNOSIS — Z9189 Other specified personal risk factors, not elsewhere classified: Secondary | ICD-10-CM | POA: Diagnosis not present

## 2019-10-08 DIAGNOSIS — R7989 Other specified abnormal findings of blood chemistry: Secondary | ICD-10-CM | POA: Diagnosis not present

## 2019-10-08 DIAGNOSIS — F3289 Other specified depressive episodes: Secondary | ICD-10-CM | POA: Diagnosis not present

## 2019-10-08 DIAGNOSIS — E559 Vitamin D deficiency, unspecified: Secondary | ICD-10-CM

## 2019-10-08 DIAGNOSIS — Z6834 Body mass index (BMI) 34.0-34.9, adult: Secondary | ICD-10-CM

## 2019-10-08 MED ORDER — VITAMIN D (ERGOCALCIFEROL) 1.25 MG (50000 UNIT) PO CAPS: 50000.0000 [IU] | ORAL_CAPSULE | ORAL | 0 refills | Status: DC

## 2019-10-08 MED ORDER — BUPROPION HCL ER (SR) 150 MG PO TB12: 150.0000 mg | ORAL_TABLET | Freq: Every day | ORAL | 0 refills | Status: DC

## 2019-10-08 NOTE — Progress Notes (Signed)
Chief Complaint:   OBESITY Carolyn Shaw is here to discuss her progress with her obesity treatment plan along with follow-up of her obesity related diagnoses. Carolyn Shaw is on the Category 3 Plan and states she is following her eating plan approximately 20% of the time. Carolyn Shaw states she is walking/running 30 minutes 3 times per week.  Today's visit was #: 10 Starting weight: 219 lbs Starting date: 04/06/2019 Today's weight: 210 lbs Today's date: 10/08/2019 Total lbs lost to date: 9 Total lbs lost since last in-office visit: 0  Interim History: Carolyn Shaw's weight remains the same, but she has done well overall. She has been stress eating.  Subjective:   Elevated TSH. Carolyn Shaw is on no medications and has no evidence of hypothyroidism. Last TSH level elevated at 5.170 on 04/06/2019.  Vitamin D deficiency. [name] is taking Vitamin D. Last Vitamin D level 30.5 on 04/06/2019.  Other depression, emotional eating. Carolyn Shaw is struggling with emotional eating and using food for comfort to the extent that it is negatively impacting her health. She has been working on behavior modification techniques to help reduce her emotional eating and has been somewhat successful. She shows no sign of suicidal or homicidal ideations.  At risk for osteoporosis. Carolyn Shaw is at higher risk of osteopenia and osteoporosis due to Vitamin D deficiency.   Assessment/Plan:   Elevated TSH. T3, T4, free, TSH labs ordered.  Vitamin D deficiency. Low Vitamin D level contributes to fatigue and are associated with obesity, breast, and colon cancer. She was given a prescription for Vitamin D, Ergocalciferol, (DRISDOL) 1.25 MG (50000 UNIT) CAPS capsule every week  #4 with 0 refills. VITAMIN D 25 Hydroxy (Vit-D Deficiency, Fractures) level ordered.  Other depression, emotional eating. Behavior modification techniques were discussed today to help [name] deal with her emotional/non-hunger eating behaviors.  Orders and follow up as  documented in patient record. Carolyn Shaw was given a prescription for buPROPion (WELLBUTRIN SR) 150 MG 12 hr tablet 1 in the a.m. #30 with 0 refills.  At risk for osteoporosis. Carolyn Shaw was given approximately 15 minutes of osteoporosis prevention counseling today. Carolyn Shaw is at risk for osteopenia and osteoporosis due to her Vitamin D deficiency. She was encouraged to take her Vitamin D and follow her higher calcium diet and increase strengthening exercise to help strengthen her bones and decrease her risk of osteopenia and osteoporosis.  Repetitive spaced learning was employed today to elicit superior memory formation and behavioral change.  Class 2 severe obesity with serious comorbidity and body mass index (BMI) of 35.0 to 35.9 in adult, unspecified obesity type ([place]).  Carolyn Shaw is currently in the action stage of change. As such, her goal is to continue with weight loss efforts. She has agreed to the Category 3 Plan and journal 400-600 calories and 40 grams of protein at supper.  She will work on meal planning, will start to journal, will not skip meals, and will stop sweets.   Exercise goals: All adults should avoid inactivity. Some physical activity is better than none, and adults who participate in any amount of physical activity gain some health benefits.  Behavioral modification strategies: increasing lean protein intake, decreasing simple carbohydrates, increasing vegetables, increasing water intake, decreasing eating out, no skipping meals, meal planning and cooking strategies, keeping healthy foods in the home and planning for success.  Carolyn Shaw has agreed to follow-up with our clinic in 2 weeks. She was informed of the importance of frequent follow-up visits to maximize her success with intensive lifestyle  modifications for her multiple health conditions.   Carolyn Shaw was informed we would discuss her lab results at her next visit unless there is a critical issue that needs to be addressed sooner. Carolyn Shaw  agreed to keep her next visit at the agreed upon time to discuss these results.  Objective:   Blood pressure 124/84, pulse 83, temperature 99 F (37.2 C), height 5\' 5"  (1.651 m), weight 210 lb (95.3 kg), SpO2 100 %. Body mass index is 34.95 kg/m.  General: Cooperative, alert, well developed, in no acute distress. HEENT: Conjunctivae and lids unremarkable. Cardiovascular: Regular rhythm.  Lungs: Normal work of breathing. Neurologic: No focal deficits.   Lab Results  Component Value Date   CREATININE 0.75 03/08/2019   BUN 11 03/08/2019   NA 136 03/08/2019   K 3.6 03/08/2019   CL 100 03/08/2019   CO2 25 03/08/2019   Lab Results  Component Value Date   ALT 14 03/08/2019   AST 18 03/08/2019   ALKPHOS 41 03/08/2019   BILITOT 0.9 03/08/2019   Lab Results  Component Value Date   HGBA1C 5.1 04/06/2019   Lab Results  Component Value Date   INSULIN 8.3 04/06/2019   Lab Results  Component Value Date   TSH 5.170 (H) 04/06/2019   No results found for: CHOL, HDL, LDLCALC, LDLDIRECT, TRIG, CHOLHDL Lab Results  Component Value Date   WBC 8.8 03/08/2019   HGB 16.3 (H) 03/08/2019   HCT 45.4 03/08/2019   MCV 84.4 03/08/2019   PLT 283 03/08/2019   No results found for: IRON, TIBC, FERRITIN  Attestation Statements:   Reviewed by clinician on day of visit: allergies, medications, problem list, medical history, surgical history, family history, social history, and previous encounter notes.  Migdalia Dk, am acting as Location manager for [employer], DO   I have reviewed the above documentation for accuracy and completeness, and I agree with the above. Jearld Lesch, DO

## 2019-10-09 LAB — T3: T3, Total: 109 ng/dL (ref 71–180)

## 2019-10-09 LAB — TSH: TSH: 3.09 u[IU]/mL (ref 0.450–4.500)

## 2019-10-09 LAB — VITAMIN D 25 HYDROXY (VIT D DEFICIENCY, FRACTURES): Vit D, 25-Hydroxy: 41.2 ng/mL (ref 30.0–100.0)

## 2019-10-09 LAB — T4, FREE: Free T4: 1.08 ng/dL (ref 0.82–1.77)

## 2019-10-12 ENCOUNTER — Other Ambulatory Visit: Payer: Self-pay

## 2019-10-12 ENCOUNTER — Telehealth: Payer: Self-pay | Admitting: Neurology

## 2019-10-12 MED ORDER — RIZATRIPTAN BENZOATE 10 MG PO TABS: 10.0000 mg | ORAL_TABLET | ORAL | 8 refills | Status: DC | PRN

## 2019-10-12 NOTE — Telephone Encounter (Signed)
Patient needs to have [place] call in a refill on the Maxalt to the [facility] on [address]   She was last seen on 07-27-19

## 2019-10-13 ENCOUNTER — Other Ambulatory Visit: Payer: Self-pay | Admitting: Cardiology

## 2019-10-13 DIAGNOSIS — Z20822 Contact with and (suspected) exposure to covid-19: Secondary | ICD-10-CM

## 2019-10-14 LAB — NOVEL CORONAVIRUS, NAA: SARS-CoV-2, NAA: NOT DETECTED

## 2019-10-27 ENCOUNTER — Encounter (INDEPENDENT_AMBULATORY_CARE_PROVIDER_SITE_OTHER): Payer: Self-pay | Admitting: Bariatrics

## 2019-10-27 ENCOUNTER — Other Ambulatory Visit: Payer: Self-pay

## 2019-10-27 ENCOUNTER — Ambulatory Visit (INDEPENDENT_AMBULATORY_CARE_PROVIDER_SITE_OTHER): Payer: BC Managed Care – PPO | Admitting: Bariatrics

## 2019-10-27 VITALS — BP 108/71 | HR 79 | Temp 98.5°F | Ht 65.0 in | Wt 212.0 lb

## 2019-10-27 DIAGNOSIS — E559 Vitamin D deficiency, unspecified: Secondary | ICD-10-CM | POA: Diagnosis not present

## 2019-10-27 DIAGNOSIS — Z6835 Body mass index (BMI) 35.0-35.9, adult: Secondary | ICD-10-CM | POA: Diagnosis not present

## 2019-10-27 DIAGNOSIS — F3289 Other specified depressive episodes: Secondary | ICD-10-CM

## 2019-10-27 NOTE — Progress Notes (Signed)
Chief Complaint:   OBESITY Carolyn Shaw is here to discuss her progress with her obesity treatment plan along with follow-up of her obesity related diagnoses. Carolyn Shaw is on the Category 3 Plan and states she is following her eating plan approximately 70% of the time. Carolyn Shaw states she is walking 20 minutes 3 times per week.  Today's visit was #: 11 Starting weight: 219 lbs Starting date: 04/06/2019 Today's weight: 212 lbs Today's date: 10/27/2019 Total lbs lost to date: 7 Total lbs lost since last in-office visit: 0  Interim History: Carolyn Shaw is up 2 lbs since her last visit. She is no longer eating the cupcakes. She is getting ready for her cycle and is up 2.6 lbs of water.  Subjective:   Vitamin D deficiency. Carolyn Shaw is taking Vitamin D. Last Vitamin D 41.2 on 10/08/2019.  Other depression, emotional eating. Carolyn Shaw is struggling with emotional eating and using food for comfort to the extent that it is negatively impacting her health. She has been working on behavior modification techniques to help reduce her emotional eating and has been somewhat successful. She shows no sign of suicidal or homicidal ideations. Carolyn Shaw was prescribed Wellbutrin at her last visit, but she is not taking as she has additional questions.  Assessment/Plan:   Vitamin D deficiency. Low Vitamin D level contributes to fatigue and are associated with obesity, breast, and colon cancer. She agrees to continue to take Vitamin D and will follow-up for routine testing of Vitamin D, at least 2-3 times per year to avoid over-replacement.  Other depression, emotional eating. Behavior modification techniques were discussed today to help Carolyn Shaw deal with her emotional/non-hunger eating behaviors.  Orders and follow up as documented in patient record. Carolyn Shaw will fill the prescription for Wellbutrin.  Class 2 severe obesity with serious comorbidity and body mass index (BMI) of 35.0 to 35.9 in adult, unspecified obesity type  (Carolyn Shaw).  Carolyn Shaw is currently in the action stage of change. As such, her goal is to continue with weight loss efforts. She has agreed to the Category 3 Plan.   She will work on meal planning and intentional eating.  Exercise goals: All adults should avoid inactivity. Some physical activity is better than none, and adults who participate in any amount of physical activity gain some health benefits.  Behavioral modification strategies: increasing lean protein intake, decreasing simple carbohydrates, increasing vegetables, increasing water intake, ways to avoid boredom eating, ways to avoid night time snacking, better snacking choices, emotional eating strategies and planning for success.  Carolyn Shaw has agreed to follow-up with our clinic in 2 weeks. She was informed of the importance of frequent follow-up visits to maximize her success with intensive lifestyle modifications for her multiple health conditions.   Objective:   Blood pressure 108/71, pulse 79, temperature 98.5 F (36.9 C), height 5\' 5"  (1.651 m), weight 212 lb (96.2 kg), last menstrual period 10/08/2019, SpO2 99 %. Body mass index is 35.28 kg/m.  General: Cooperative, alert, well developed, in no acute distress. HEENT: Conjunctivae and lids unremarkable. Cardiovascular: Regular rhythm.  Lungs: Normal work of breathing. Neurologic: No focal deficits.   Lab Results  Component Value Date   CREATININE 0.75 03/08/2019   BUN 11 03/08/2019   NA 136 03/08/2019   K 3.6 03/08/2019   CL 100 03/08/2019   CO2 25 03/08/2019   Lab Results  Component Value Date   ALT 14 03/08/2019   AST 18 03/08/2019   ALKPHOS 41 03/08/2019   BILITOT 0.9  03/08/2019   Lab Results  Component Value Date   HGBA1C 5.1 04/06/2019   Lab Results  Component Value Date   INSULIN 8.3 04/06/2019   Lab Results  Component Value Date   TSH 3.090 10/08/2019   No results found for: CHOL, HDL, LDLCALC, LDLDIRECT, TRIG, CHOLHDL Lab Results  Component Value  Date   WBC 8.8 03/08/2019   HGB 16.3 (H) 03/08/2019   HCT 45.4 03/08/2019   MCV 84.4 03/08/2019   PLT 283 03/08/2019   No results found for: IRON, TIBC, FERRITIN  Attestation Statements:   Reviewed by clinician on day of visit: allergies, medications, problem list, medical history, surgical history, family history, social history, and previous encounter notes.  Time spent on visit including pre-visit chart review and post-visit charting and care was 20 minutes.   Migdalia Dk, am acting as Location manager for [employer], DO   I have reviewed the above documentation for accuracy and completeness, and I agree with the above. Jearld Lesch, DO

## 2019-10-28 ENCOUNTER — Encounter (INDEPENDENT_AMBULATORY_CARE_PROVIDER_SITE_OTHER): Payer: Self-pay | Admitting: Bariatrics

## 2019-10-31 ENCOUNTER — Ambulatory Visit: Payer: BC Managed Care – PPO

## 2019-11-16 ENCOUNTER — Other Ambulatory Visit: Payer: Self-pay

## 2019-11-16 ENCOUNTER — Ambulatory Visit (INDEPENDENT_AMBULATORY_CARE_PROVIDER_SITE_OTHER): Payer: BC Managed Care – PPO | Admitting: Family Medicine

## 2019-11-16 ENCOUNTER — Encounter (INDEPENDENT_AMBULATORY_CARE_PROVIDER_SITE_OTHER): Payer: Self-pay | Admitting: Family Medicine

## 2019-11-16 VITALS — BP 114/74 | HR 78 | Temp 98.2°F | Ht 65.0 in | Wt 213.0 lb

## 2019-11-16 DIAGNOSIS — E559 Vitamin D deficiency, unspecified: Secondary | ICD-10-CM | POA: Diagnosis not present

## 2019-11-16 DIAGNOSIS — F3289 Other specified depressive episodes: Secondary | ICD-10-CM

## 2019-11-16 DIAGNOSIS — Z9189 Other specified personal risk factors, not elsewhere classified: Secondary | ICD-10-CM

## 2019-11-16 DIAGNOSIS — Z6835 Body mass index (BMI) 35.0-35.9, adult: Secondary | ICD-10-CM

## 2019-11-16 MED ORDER — VITAMIN D (ERGOCALCIFEROL) 1.25 MG (50000 UNIT) PO CAPS: 50000.0000 [IU] | ORAL_CAPSULE | ORAL | 0 refills | Status: DC

## 2019-11-17 ENCOUNTER — Encounter (INDEPENDENT_AMBULATORY_CARE_PROVIDER_SITE_OTHER): Payer: Self-pay | Admitting: Family Medicine

## 2019-11-17 DIAGNOSIS — F329 Major depressive disorder, single episode, unspecified: Secondary | ICD-10-CM | POA: Insufficient documentation

## 2019-11-17 DIAGNOSIS — E559 Vitamin D deficiency, unspecified: Secondary | ICD-10-CM | POA: Insufficient documentation

## 2019-11-17 DIAGNOSIS — F32A Depression, unspecified: Secondary | ICD-10-CM | POA: Insufficient documentation

## 2019-11-17 NOTE — Progress Notes (Signed)
Chief Complaint:   OBESITY Carolyn Shaw is here to discuss her progress with her obesity treatment plan along with follow-up of her obesity related diagnoses. Carolyn Shaw is on the Category 3 Plan and states she is following her eating plan approximately 60% of the time. Nina states she is doing 0 minutes 0 times per week.  Today's visit was #: 12 Starting weight: 219 lbs Starting date: 04/06/2019 Today's weight: 213 lbs Today's date: 11/16/2019 Total lbs lost to date: 6 Total lbs lost since last in-office visit: 0  Interim History: Carolyn Shaw is sometimes skipping meals and then overeating later in the day. Her water intake is inadequate.  Subjective:   1. Vitamin D deficiency Carolyn Shaw's Vit D level is not at goal. She is on prescription Vit D.  2. Other depression, emotional eating Carolyn Shaw was prescribed bupropion but she has not started it yet. She reports cravings for sweets.  3. At risk for side effect of medication Carolyn Shaw is at risk of drug side effects due to bupropion.  Assessment/Plan:   1. Vitamin D deficiency Low Vitamin D level contributes to fatigue and are associated with obesity, breast, and colon cancer. We will refill prescription Vitamin D for 1 month. Carolyn Shaw will follow-up for routine testing of Vitamin D, at least 2-3 times per year to avoid over-replacement.  - Vitamin D, Ergocalciferol, (DRISDOL) 1.25 MG (50000 UNIT) CAPS capsule; Take 1 capsule (50,000 Units total) by mouth every 7 (seven) days.  Dispense: 4 capsule; Refill: 0  2. Other depression, emotional eating Behavior modification techniques were discussed today to help Carolyn Shaw deal with her emotional/non-hunger eating behaviors. Carolyn Shaw will try bupropion on the weekend. Orders and follow up as documented in patient record.   3. At risk for side effect of medication Carolyn Shaw was given approximately 15 minutes of drug side effect counseling today. We discussed side effect possibility and risk versus benefits. Carolyn Shaw agreed to  the medication and will contact this office if these side effects are intolerable.  Repetitive spaced learning was employed today to elicit superior memory formation and behavioral change.  4. Class 2 severe obesity with serious comorbidity and body mass index (BMI) of 35.0 to 35.9 in adult, unspecified obesity type ([facility]) Carolyn Shaw is currently in the action stage of change. As such, her goal is to continue with weight loss efforts. She has agreed to keeping a food journal and adhering to recommended goals of 1400-1500 calories and 90-100 grams of protein daily.   Carolyn Shaw will track her calories and protein using [employer] It.  Exercise goals: No exercise has been prescribed at this time.  Behavioral modification strategies: increasing lean protein intake, no skipping meals and keeping a strict food journal.  Carolyn Shaw has agreed to follow-up with our clinic in 3 weeks. She was informed of the importance of frequent follow-up visits to maximize her success with intensive lifestyle modifications for her multiple health conditions.   Objective:   Blood pressure 114/74, pulse 78, temperature 98.2 F (36.8 C), temperature source Oral, height 5\' 5"  (1.651 m), weight 213 lb (96.6 kg), last menstrual period 10/29/2019, SpO2 97 %. Body mass index is 35.45 kg/m.  General: Cooperative, alert, well developed, in no acute distress. HEENT: Conjunctivae and lids unremarkable. Cardiovascular: Regular rhythm.  Lungs: Normal work of breathing. Neurologic: No focal deficits.   Lab Results  Component Value Date   CREATININE 0.75 03/08/2019   BUN 11 03/08/2019   NA 136 03/08/2019   K 3.6 03/08/2019   CL 100  03/08/2019   CO2 25 03/08/2019   Lab Results  Component Value Date   ALT 14 03/08/2019   AST 18 03/08/2019   ALKPHOS 41 03/08/2019   BILITOT 0.9 03/08/2019   Lab Results  Component Value Date   HGBA1C 5.1 04/06/2019   Lab Results  Component Value Date   INSULIN 8.3 04/06/2019   Lab Results    Component Value Date   TSH 3.090 10/08/2019   No results found for: CHOL, HDL, LDLCALC, LDLDIRECT, TRIG, CHOLHDL Lab Results  Component Value Date   WBC 8.8 03/08/2019   HGB 16.3 (H) 03/08/2019   HCT 45.4 03/08/2019   MCV 84.4 03/08/2019   PLT 283 03/08/2019   No results found for: IRON, TIBC, FERRITIN  Attestation Statements:   Reviewed by clinician on day of visit: allergies, medications, problem list, medical history, surgical history, family history, social history, and previous encounter notes.   Wilhemena Durie, am acting as Location manager for [employer], FNP-C.  I have reviewed the above documentation for accuracy and completeness, and I agree with the above. -  Georgianne Fick, FNP

## 2019-11-30 ENCOUNTER — Ambulatory Visit (INDEPENDENT_AMBULATORY_CARE_PROVIDER_SITE_OTHER): Payer: BC Managed Care – PPO | Admitting: Family Medicine

## 2019-12-15 ENCOUNTER — Encounter (INDEPENDENT_AMBULATORY_CARE_PROVIDER_SITE_OTHER): Payer: Self-pay | Admitting: Family Medicine

## 2019-12-15 ENCOUNTER — Ambulatory Visit (INDEPENDENT_AMBULATORY_CARE_PROVIDER_SITE_OTHER): Payer: BC Managed Care – PPO | Admitting: Family Medicine

## 2019-12-15 ENCOUNTER — Other Ambulatory Visit: Payer: Self-pay

## 2019-12-15 VITALS — BP 114/77 | HR 74 | Temp 97.4°F | Ht 65.0 in | Wt 209.0 lb

## 2019-12-15 DIAGNOSIS — E6609 Other obesity due to excess calories: Secondary | ICD-10-CM | POA: Diagnosis not present

## 2019-12-15 DIAGNOSIS — F3289 Other specified depressive episodes: Secondary | ICD-10-CM

## 2019-12-15 DIAGNOSIS — Z6834 Body mass index (BMI) 34.0-34.9, adult: Secondary | ICD-10-CM

## 2019-12-15 DIAGNOSIS — E559 Vitamin D deficiency, unspecified: Secondary | ICD-10-CM | POA: Diagnosis not present

## 2019-12-15 MED ORDER — BUPROPION HCL ER (SR) 150 MG PO TB12: 150.0000 mg | ORAL_TABLET | Freq: Every day | ORAL | 0 refills | Status: DC

## 2019-12-15 MED ORDER — VITAMIN D (ERGOCALCIFEROL) 1.25 MG (50000 UNIT) PO CAPS: 50000.0000 [IU] | ORAL_CAPSULE | ORAL | 0 refills | Status: DC

## 2019-12-16 ENCOUNTER — Encounter (INDEPENDENT_AMBULATORY_CARE_PROVIDER_SITE_OTHER): Payer: Self-pay | Admitting: Family Medicine

## 2019-12-16 NOTE — Progress Notes (Signed)
Chief Complaint:   OBESITY Coutney is here to discuss her progress with her obesity treatment plan along with follow-up of her obesity related diagnoses. Ebby is on keeping a food journal and adhering to recommended goals of 1400-1500 calories and 90-100 grams of protein daily and states she is following her eating plan approximately 60% of the time. Tatyana states she is doing 0 minutes 0 times per week.  Today's visit was #: 65 Starting weight: 219 lbs Starting date: 04/06/2019 Today's weight: 209 lbs Today's date: 12/15/2019 Total lbs lost to date: 10 Total lbs lost since last in-office visit: 4  Interim History: Geriah has not had an office visit for 5 weeks. She is journaling Monday through Friday and but not as much on the weekend. She is mostly meeting her calorie and protein goals.  Subjective:   1. Vitamin D deficiency Safiyya's last Vit D level was low at 41.2. She is on high dose Vit D.  2. Other depression, emotional eating Elim started on bupropion and felt that it kept her from going to sleep as early as she should. She started taking it earlier and this resolved the problem . She does notice it increased her focus and decreased cravings. She wants to continue the medication.  Assessment/Plan:   1. Vitamin D deficiency Low Vitamin D level contributes to fatigue and are associated with obesity, breast, and colon cancer. We will refill prescription Vitamin D for 1 month. Alliene will follow-up for routine testing of Vitamin D, at least 2-3 times per year to avoid over-replacement.  - Vitamin D, Ergocalciferol, (DRISDOL) 1.25 MG (50000 UNIT) CAPS capsule; Take 1 capsule (50,000 Units total) by mouth every 7 (seven) days.  Dispense: 4 capsule; Refill: 0  2. Other depression, emotional eating Behavior modification techniques were discussed today to help Floella deal with her emotional/non-hunger eating behaviors. We will refill bupropion for 1 month. Orders and follow up as  documented in patient record.   - buPROPion (WELLBUTRIN SR) 150 MG 12 hr tablet; Take 1 tablet (150 mg total) by mouth daily.  Dispense: 30 tablet; Refill: 0  3. Class 1 obesity due to excess calories without serious comorbidity with body mass index (BMI) of 34.0 to 34.9 in adult Alescia is currently in the action stage of change. As such, her goal is to continue with weight loss efforts. She has agreed to keeping a food journal and adhering to recommended goals of 1400-1600 calories and 90 grams of protein daily.   Caiden is to increase journaling to 6 days out of 7 days/week.  Exercise goals: All adults should avoid inactivity. Some physical activity is better than none, and adults who participate in any amount of physical activity gain some health benefits.  Behavioral modification strategies: keeping a strict food journal.  Maily has agreed to follow-up with our clinic in 2 to 3 weeks. She was informed of the importance of frequent follow-up visits to maximize her success with intensive lifestyle modifications for her multiple health conditions.   Objective:   Blood pressure 114/77, pulse 74, temperature (!) 97.4 F (36.3 C), temperature source Oral, height 5\' 5"  (1.651 m), weight 209 lb (94.8 kg), last menstrual period 12/14/2019, SpO2 98 %. Body mass index is 34.78 kg/m.  General: Cooperative, alert, well developed, in no acute distress. HEENT: Conjunctivae and lids unremarkable. Cardiovascular: Regular rhythm.  Lungs: Normal work of breathing. Neurologic: No focal deficits.   Lab Results  Component Value Date   CREATININE 0.75  03/08/2019   BUN 11 03/08/2019   NA 136 03/08/2019   K 3.6 03/08/2019   CL 100 03/08/2019   CO2 25 03/08/2019   Lab Results  Component Value Date   ALT 14 03/08/2019   AST 18 03/08/2019   ALKPHOS 41 03/08/2019   BILITOT 0.9 03/08/2019   Lab Results  Component Value Date   HGBA1C 5.1 04/06/2019   Lab Results  Component Value Date   INSULIN  8.3 04/06/2019   Lab Results  Component Value Date   TSH 3.090 10/08/2019   No results found for: CHOL, HDL, LDLCALC, LDLDIRECT, TRIG, CHOLHDL Lab Results  Component Value Date   WBC 8.8 03/08/2019   HGB 16.3 (H) 03/08/2019   HCT 45.4 03/08/2019   MCV 84.4 03/08/2019   PLT 283 03/08/2019   No results found for: IRON, TIBC, FERRITIN  Attestation Statements:   Reviewed by clinician on day of visit: allergies, medications, problem list, medical history, surgical history, family history, social history, and previous encounter notes.   Wilhemena Durie, am acting as Location manager for [employer], FNP-C.  I have reviewed the above documentation for accuracy and completeness, and I agree with the above. -  Georgianne Fick, FNP

## 2020-01-05 ENCOUNTER — Ambulatory Visit (INDEPENDENT_AMBULATORY_CARE_PROVIDER_SITE_OTHER): Payer: BC Managed Care – PPO | Admitting: Family Medicine

## 2020-01-05 ENCOUNTER — Other Ambulatory Visit: Payer: Self-pay

## 2020-01-05 ENCOUNTER — Encounter (INDEPENDENT_AMBULATORY_CARE_PROVIDER_SITE_OTHER): Payer: Self-pay | Admitting: Family Medicine

## 2020-01-05 VITALS — BP 105/71 | HR 82 | Temp 98.5°F | Ht 65.0 in | Wt 209.0 lb

## 2020-01-05 DIAGNOSIS — Z6834 Body mass index (BMI) 34.0-34.9, adult: Secondary | ICD-10-CM | POA: Diagnosis not present

## 2020-01-05 DIAGNOSIS — E6609 Other obesity due to excess calories: Secondary | ICD-10-CM

## 2020-01-05 DIAGNOSIS — E8881 Metabolic syndrome: Secondary | ICD-10-CM

## 2020-01-06 ENCOUNTER — Encounter (INDEPENDENT_AMBULATORY_CARE_PROVIDER_SITE_OTHER): Payer: Self-pay | Admitting: Family Medicine

## 2020-01-06 DIAGNOSIS — E8881 Metabolic syndrome: Secondary | ICD-10-CM | POA: Insufficient documentation

## 2020-01-06 DIAGNOSIS — E88819 Insulin resistance, unspecified: Secondary | ICD-10-CM | POA: Insufficient documentation

## 2020-01-06 NOTE — Progress Notes (Signed)
Chief Complaint:   OBESITY Carolyn Shaw is here to discuss her progress with her obesity treatment plan along with follow-up of her obesity related diagnoses. Carolyn Shaw is on keeping a food journal and adhering to recommended goals of 1400-1600 calories and 90 grams of protein daily and states she is following her eating plan approximately 10% of the time. Carolyn Shaw states she is walking for 30 minutes 2 times per week.  Today's visit was #: 14 Starting weight: 219 lbs Starting date: 04/06/2019 Today's weight: 209 lbs Today's date: 01/05/2020 Total lbs lost to date: 10 Total lbs lost since last in-office visit: 0  Interim History: Carolyn Shaw notes excessive stress due to helping care for her sister who had rectal surgery recently in [place]. She has skipped meals and has been unable to journal. She wants to begin meal prepping lunch because her lunch has been off plan quite a bit..  Subjective:   1. Insulin resistance Carolyn Shaw denies polyphagia, and she is not on metformin. Lab Results  Component Value Date   HGBA1C 5.1 04/06/2019    Assessment/Plan:   1. Insulin resistance Carolyn Shaw will continue her meal plan, and will continue to work on weight loss, exercise, and decreasing simple carbohydrates to help decrease the risk of diabetes. Carolyn Shaw agreed to follow-up with [place] as directed to closely monitor her progress.  2. Class 1 obesity due to excess calories without serious comorbidity with body mass index (BMI) of 34.0 to 34.9 in adult Carolyn Shaw is currently in the action stage of change. As such, her goal is to continue with weight loss efforts. She has agreed to keeping a food journal and adhering to recommended goals of 1400-1600 calories and 90 grams of protein daily.   Carolyn Shaw is to meal prep for lunch, and journal 6 out of 7 days per week. Exercise goals: As is.  Behavioral modification strategies: increasing lean protein intake, no skipping meals, meal planning and cooking strategies and keeping a  strict food journal.  Carolyn Shaw has agreed to follow-up with our clinic in 3 weeks. She was informed of the importance of frequent follow-up visits to maximize her success with intensive lifestyle modifications for her multiple health conditions.   Objective:   Blood pressure 105/71, pulse 82, temperature 98.5 F (36.9 C), temperature source Oral, height 5\' 5"  (1.651 m), weight 209 lb (94.8 kg), last menstrual period 12/14/2019, SpO2 98 %. Body mass index is 34.78 kg/m.  General: Cooperative, alert, well developed, in no acute distress. HEENT: Conjunctivae and lids unremarkable. Cardiovascular: Regular rhythm.  Lungs: Normal work of breathing. Neurologic: No focal deficits.   Lab Results  Component Value Date   CREATININE 0.75 03/08/2019   BUN 11 03/08/2019   NA 136 03/08/2019   K 3.6 03/08/2019   CL 100 03/08/2019   CO2 25 03/08/2019   Lab Results  Component Value Date   ALT 14 03/08/2019   AST 18 03/08/2019   ALKPHOS 41 03/08/2019   BILITOT 0.9 03/08/2019   Lab Results  Component Value Date   HGBA1C 5.1 04/06/2019   Lab Results  Component Value Date   INSULIN 8.3 04/06/2019   Lab Results  Component Value Date   TSH 3.090 10/08/2019   No results found for: CHOL, HDL, LDLCALC, LDLDIRECT, TRIG, CHOLHDL Lab Results  Component Value Date   WBC 8.8 03/08/2019   HGB 16.3 (H) 03/08/2019   HCT 45.4 03/08/2019   MCV 84.4 03/08/2019   PLT 283 03/08/2019   No results found for:  IRON, TIBC, FERRITIN  Attestation Statements:   Reviewed by clinician on day of visit: allergies, medications, problem list, medical history, surgical history, family history, social history, and previous encounter notes.   Wilhemena Durie, am acting as Location manager for [employer], FNP-C.  I have reviewed the above documentation for accuracy and completeness, and I agree with the above. -  Carolyn Fick, FNP

## 2020-01-27 ENCOUNTER — Other Ambulatory Visit: Payer: Self-pay

## 2020-01-27 ENCOUNTER — Ambulatory Visit (INDEPENDENT_AMBULATORY_CARE_PROVIDER_SITE_OTHER): Payer: BC Managed Care – PPO | Admitting: Family Medicine

## 2020-01-27 ENCOUNTER — Encounter (INDEPENDENT_AMBULATORY_CARE_PROVIDER_SITE_OTHER): Payer: Self-pay | Admitting: Family Medicine

## 2020-01-27 VITALS — BP 93/61 | HR 79 | Temp 98.5°F | Ht 65.0 in | Wt 211.0 lb

## 2020-01-27 DIAGNOSIS — F3289 Other specified depressive episodes: Secondary | ICD-10-CM

## 2020-01-27 DIAGNOSIS — Z9189 Other specified personal risk factors, not elsewhere classified: Secondary | ICD-10-CM | POA: Diagnosis not present

## 2020-01-27 DIAGNOSIS — E559 Vitamin D deficiency, unspecified: Secondary | ICD-10-CM | POA: Diagnosis not present

## 2020-01-27 DIAGNOSIS — Z6835 Body mass index (BMI) 35.0-35.9, adult: Secondary | ICD-10-CM

## 2020-01-27 MED ORDER — BUPROPION HCL ER (SR) 150 MG PO TB12: 150.0000 mg | ORAL_TABLET | Freq: Every day | ORAL | 0 refills | Status: DC

## 2020-01-27 MED ORDER — VITAMIN D (ERGOCALCIFEROL) 1.25 MG (50000 UNIT) PO CAPS: 50000.0000 [IU] | ORAL_CAPSULE | ORAL | 0 refills | Status: DC

## 2020-01-28 ENCOUNTER — Encounter (INDEPENDENT_AMBULATORY_CARE_PROVIDER_SITE_OTHER): Payer: Self-pay | Admitting: Family Medicine

## 2020-01-28 NOTE — Progress Notes (Signed)
Chief Complaint:   OBESITY Carolyn Shaw is here to discuss her progress with her obesity treatment plan along with follow-up of her obesity related diagnoses. Carolyn Shaw is on keeping a food journal and adhering to recommended goals of 1400-1600 calories and 90 grams of protein daily and states she is following her eating plan approximately 60% of the time. Carolyn Shaw states she is doing 0 minutes 0 times per week.  Today's visit was #: 15 Starting weight: 219 lbs Starting date: 04/06/2019 Today's weight: 211 lbs Today's date: 01/27/2020 Total lbs lost to date: 8 Total lbs lost since last in-office visit: 0  Interim History: Carolyn Shaw has reduced meal skipping but she notes being off the plan due to the recent holiday. She is getting more protein in overall. She is asking about using a meal prep service. She is doing some meal planning but wants to try to do more over the summer. 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  Subjective:   1. Vitamin D deficiency Carolyn Shaw's last Vit D level was low at 41.2. She is on weekly Vit D high dose.  2. Other depression, emotional eating Carolyn Shaw reports bupropion helps with focus and cravings. She does note insomnia if taken late in the day.  3. At risk for deficient intake of food The patient is at a higher than average risk of deficient intake of food due to lack of protein from occasional meal skipping.  Assessment/Plan:   1. Vitamin D deficiency Low Vitamin D level contributes to fatigue and are associated with obesity, breast, and colon cancer. We will refill prescription Vitamin D for 1 month. Carolyn Shaw will follow-up for routine testing of Vitamin D, at least 2-3 times per year to avoid over-replacement.  - Vitamin D, Ergocalciferol, (DRISDOL) 1.25 MG (50000 UNIT) CAPS capsule; Take 1 capsule (50,000 Units total) by mouth every 7 (seven) days.  Dispense: 4 capsule; Refill: 0  2. Other depression, emotional eating Behavior modification techniques were discussed today  to help Carolyn Shaw deal with her emotional/non-hunger eating behaviors. We will refill bupropion for 1 month. Orders and follow up as documented in patient record.   - buPROPion (WELLBUTRIN SR) 150 MG 12 hr tablet; Take 1 tablet (150 mg total) by mouth daily.  Dispense: 30 tablet; Refill: 0  3. At risk for deficient intake of food Carolyn Shaw was given approximately 15 minutes of deficit intake of food prevention counseling today. Carolyn Shaw is at risk for eating too few calories based on current food recall. She was encouraged to focus on meeting caloric and protein goals according to her recommended meal plan.   4. Class 2 severe obesity with serious comorbidity and body mass index (BMI) of 35.0 to 35.9 in adult, unspecified obesity type (HCC) Carolyn Shaw is currently in the action stage of change. As such, her goal is to continue with weight loss efforts. She has agreed to keeping a food journal and adhering to recommended goals of 1400-1600 calories and 90 grams of protein daily.   Exercise goals: All adults should avoid inactivity. Some physical activity is better than none, and adults who participate in any amount of physical activity gain some health benefits.  Behavioral modification strategies: increasing lean protein intake, no skipping meals, meal planning and cooking strategies and planning for success.  Carolyn Shaw has agreed to follow-up with our clinic in 3 weeks. She was informed of the importance of frequent follow-up visits to maximize her success with intensive lifestyle modifications for her multiple health conditions.   Objective:  Blood pressure 93/61, pulse 79, temperature 98.5 F (36.9 C), temperature source Oral, height 5\' 5"  (1.651 m), weight 211 lb (95.7 kg), SpO2 98 %. Body mass index is 35.11 kg/m.  General: Cooperative, alert, well developed, in no acute distress. HEENT: Conjunctivae and lids unremarkable. Cardiovascular: Regular rhythm.  Lungs: Normal work of breathing. Neurologic: No  focal deficits.   Lab Results  Component Value Date   CREATININE 0.75 03/08/2019   BUN 11 03/08/2019   NA 136 03/08/2019   K 3.6 03/08/2019   CL 100 03/08/2019   CO2 25 03/08/2019   Lab Results  Component Value Date   ALT 14 03/08/2019   AST 18 03/08/2019   ALKPHOS 41 03/08/2019   BILITOT 0.9 03/08/2019   Lab Results  Component Value Date   HGBA1C 5.1 04/06/2019   Lab Results  Component Value Date   INSULIN 8.3 04/06/2019   Lab Results  Component Value Date   TSH 3.090 10/08/2019   No results found for: CHOL, HDL, LDLCALC, LDLDIRECT, TRIG, CHOLHDL Lab Results  Component Value Date   WBC 8.8 03/08/2019   HGB 16.3 (H) 03/08/2019   HCT 45.4 03/08/2019   MCV 84.4 03/08/2019   PLT 283 03/08/2019   No results found for: IRON, TIBC, FERRITIN  Attestation Statements:   Reviewed by clinician on day of visit: allergies, medications, problem list, medical history, surgical history, family history, social history, and previous encounter notes.   Wilhemena Durie, am acting as Location manager for [employer], FNP-C.  I have reviewed the above documentation for accuracy and completeness, and I agree with the above. -  Georgianne Fick, FNP

## 2020-02-11 IMAGING — MG MM DIGITAL DIAGNOSTIC UNILAT*R* W/ TOMO W/ CAD
4 series · 4 of 12 positions shown · non-contrast
Comparison: Previous exam(s).

CLINICAL DATA: Recall from screening mammogram for a questioned
asymmetry in the right breast.

EXAM:
DIGITAL DIAGNOSTIC RIGHT MAMMOGRAM WITH TOMO
ULTRASOUND RIGHT BREAST

[R CC synth-2D]
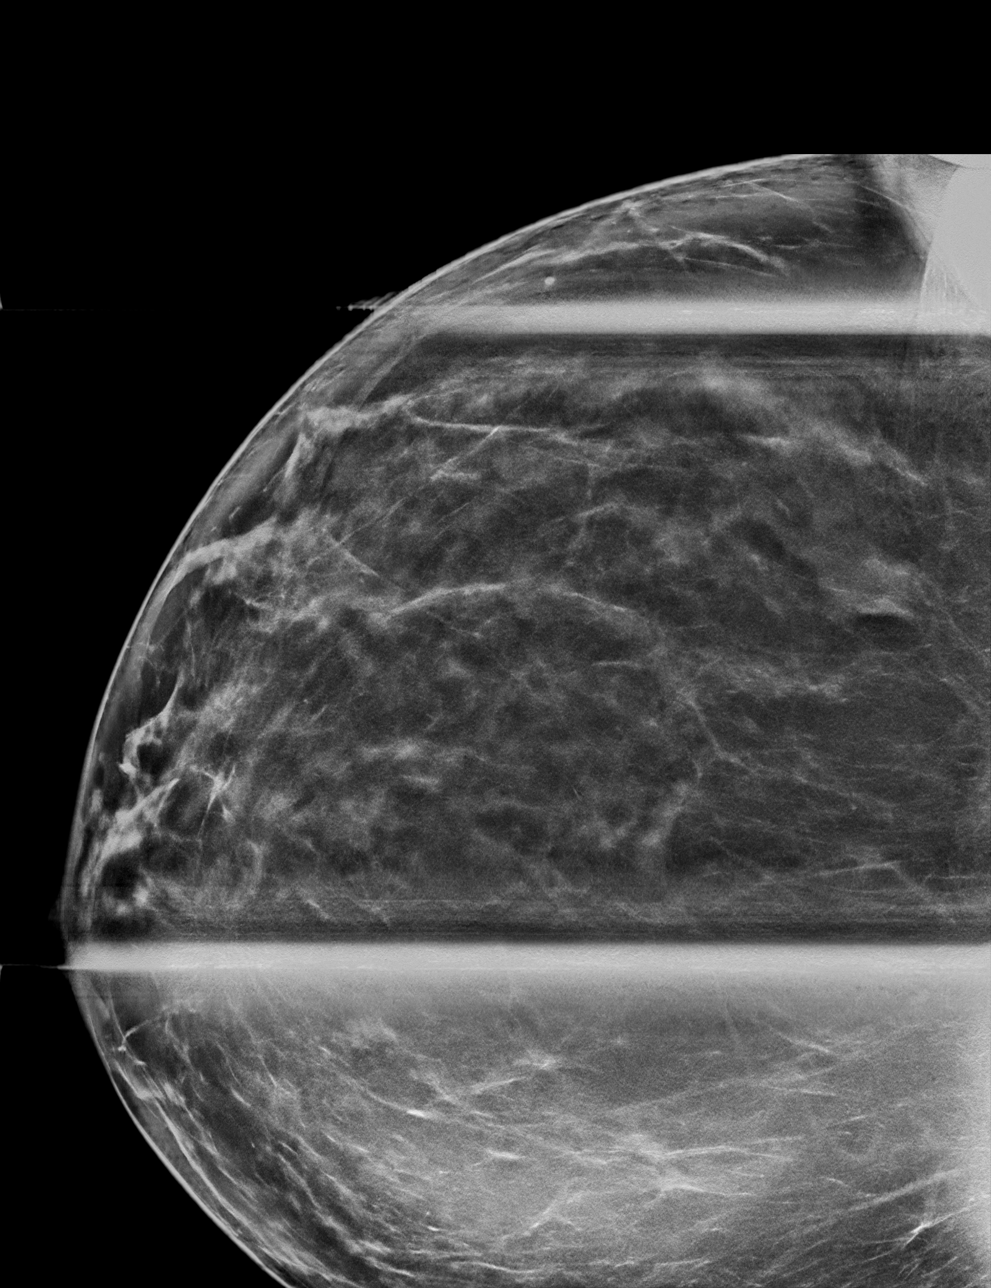

[R MLO synth-2D]
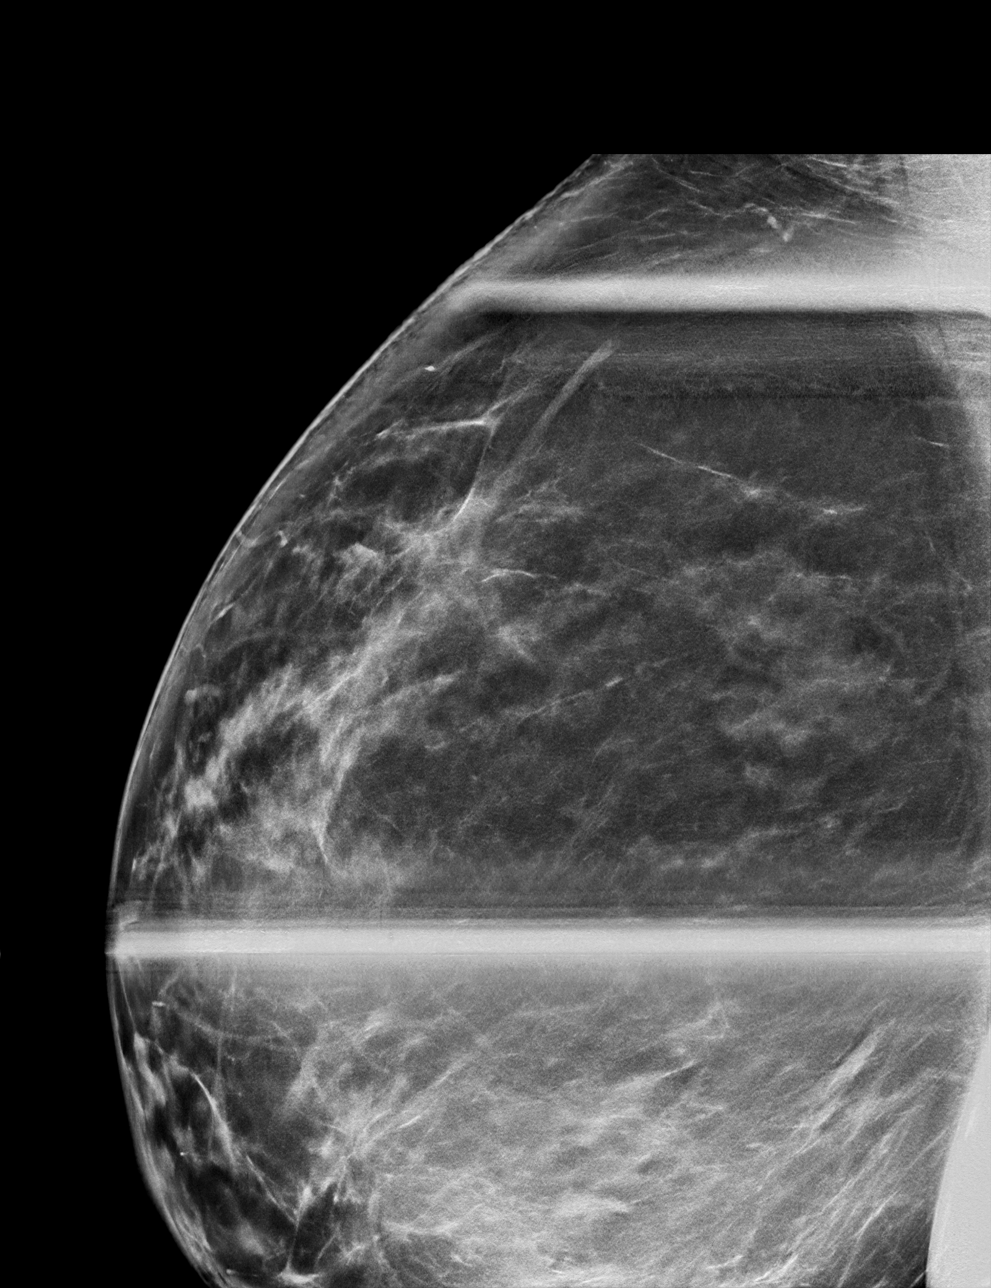

[R MLO tomo · tomo slice 47/94.0]
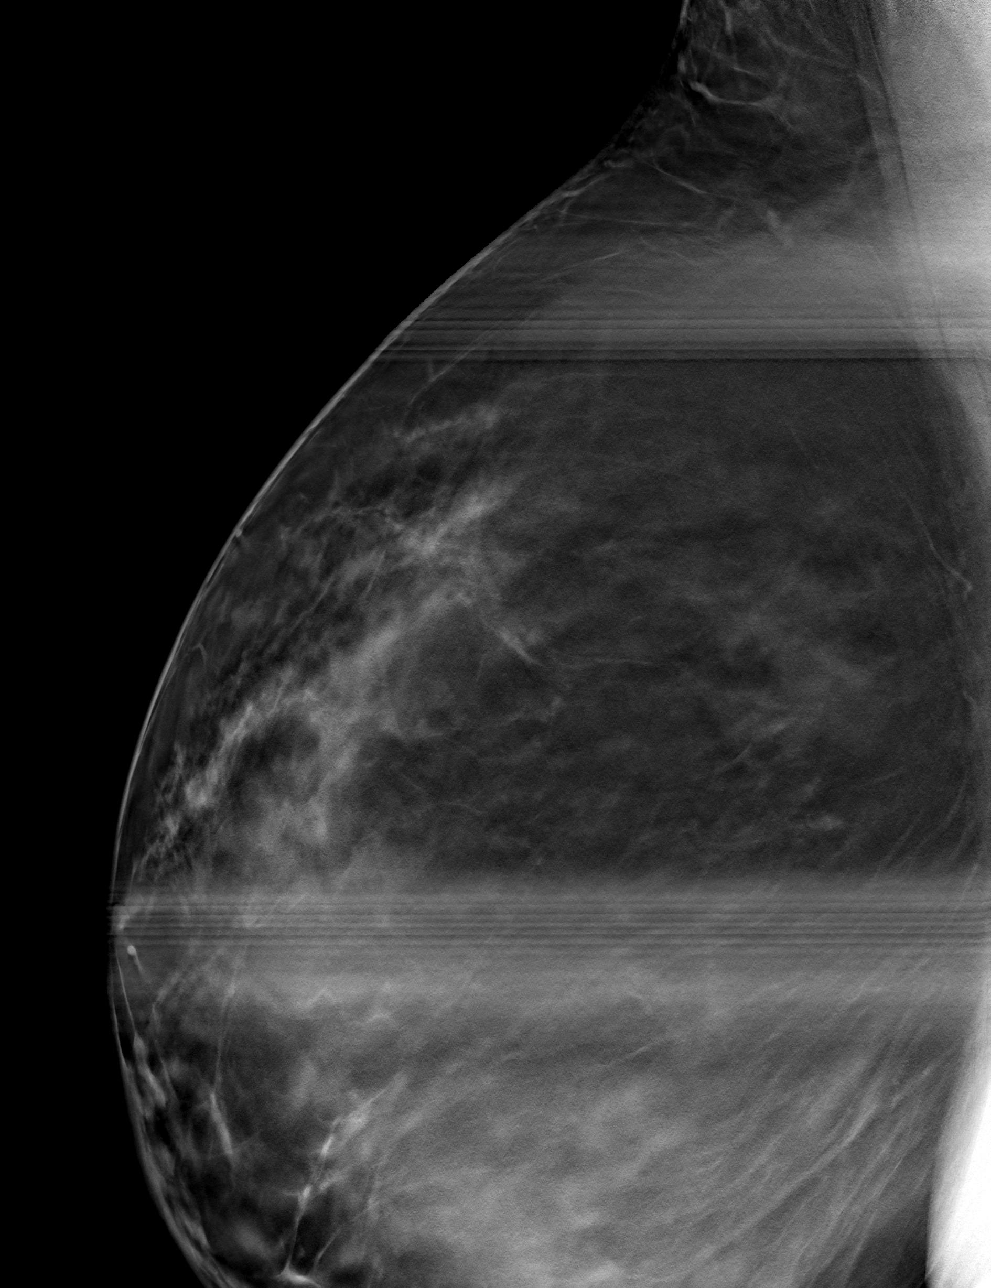

[R CC tomo · tomo slice 45/90.0]
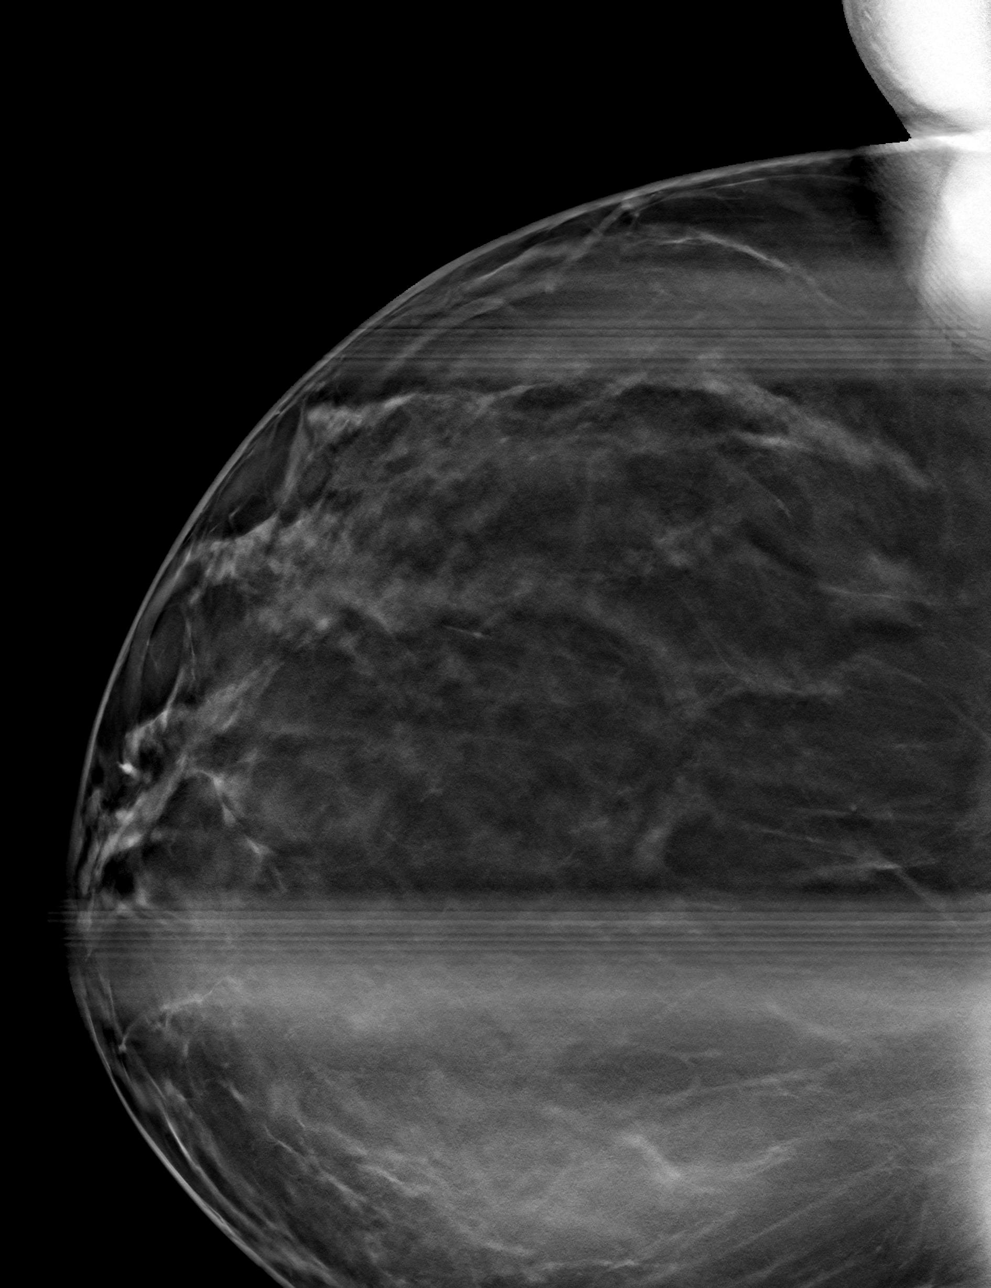

[4 of 12 positions shown; findings below may reference images not displayed]

ACR Breast Density Category c: The breast tissue is heterogeneously
dense, which may obscure small masses.
FINDINGS: The previously described finding does not persist with additional
views, consistent with superimposed fibroglandular tissue. No
suspicious mass, microcalcification, or other finding is identified.

Targeted right breast ultrasound was performed from the 7 o'clock to
the 11 o'clock position in the area of the previously questioned
asymmetry. No suspicious solid or cystic mass is identified. Normal
fibroglandular tissue is seen.
IMPRESSION: No evidence of malignancy in the right breast.

RECOMMENDATION:
Recommend routine annual screening mammogram in 1 year.

I have discussed the findings and recommendations with the patient.
If applicable, a reminder letter will be sent to the patient
regarding the next appointment.

BI-RADS CATEGORY  1: Negative.

## 2020-02-16 ENCOUNTER — Ambulatory Visit (INDEPENDENT_AMBULATORY_CARE_PROVIDER_SITE_OTHER): Payer: BC Managed Care – PPO | Admitting: Family Medicine

## 2020-02-16 ENCOUNTER — Encounter (INDEPENDENT_AMBULATORY_CARE_PROVIDER_SITE_OTHER): Payer: Self-pay | Admitting: Family Medicine

## 2020-02-16 ENCOUNTER — Other Ambulatory Visit: Payer: Self-pay

## 2020-02-16 VITALS — BP 136/80 | HR 72 | Temp 98.5°F | Ht 65.0 in | Wt 213.0 lb

## 2020-02-16 DIAGNOSIS — Z6835 Body mass index (BMI) 35.0-35.9, adult: Secondary | ICD-10-CM

## 2020-02-16 DIAGNOSIS — F3289 Other specified depressive episodes: Secondary | ICD-10-CM | POA: Diagnosis not present

## 2020-02-18 NOTE — Progress Notes (Signed)
Chief Complaint:   OBESITY Carolyn Shaw is here to discuss her progress with her obesity treatment plan along with follow-up of her obesity related diagnoses. Carolyn Shaw is on keeping a food journal and adhering to recommended goals of 1400-1600 calories and 90 grams of protein daily and states she is following her eating plan approximately 60% of the time. Carolyn Shaw states she is doing 0 minutes 0 times per week.  Today's visit was #: 40 Starting weight: 219 lbs Starting date: 04/06/2019 Today's weight: 213 lbs Today's date: 02/16/2020 Total lbs lost to date: 6 Total lbs lost since last in-office visit: 0  Interim History: Carolyn Shaw is working on meal prepping more. She is thinking about using a meal prep service. She is not journaling consistently. She notes she gets only 50-60 grams of protein daily.  Subjective:   1. Other depression, emotional eating Carolyn Shaw notes bupropion is helpful with increasing her ability to refuse snacks.  Assessment/Plan:   1. Other depression, emotional eating Behavior modification techniques were discussed today to help Carolyn Shaw deal with her emotional/non-hunger eating behaviors. Carolyn Shaw will continue her bupropion at her current dose. Orders and follow up as documented in patient record.   2. Class 2 severe obesity with serious comorbidity and body mass index (BMI) of 35.0 to 35.9 in adult, unspecified obesity type (HCC) Carolyn Shaw is currently in the action stage of change. As such, her goal is to continue with weight loss efforts. She has agreed to the Category 3 Plan or keeping a food journal and adhering to recommended goals of 1400-1600 calories and 90 grams of protein daily.   Exercise goals: No exercise has been prescribed at this time.  Behavioral modification strategies: increasing lean protein intake, meal planning and cooking strategies and planning for success.  Carolyn Shaw has agreed to follow-up with our clinic in 3 weeks. She was informed of the importance of  frequent follow-up visits to maximize her success with intensive lifestyle modifications for her multiple health conditions.   Objective:   Blood pressure 136/80, pulse 72, temperature 98.5 F (36.9 C), temperature source Oral, height 5\' 5"  (1.651 m), weight 213 lb (96.6 kg), SpO2 98 %. Body mass index is 35.45 kg/m.  General: Cooperative, alert, well developed, in no acute distress. HEENT: Conjunctivae and lids unremarkable. Cardiovascular: Regular rhythm.  Lungs: Normal work of breathing. Neurologic: No focal deficits.   Lab Results  Component Value Date   CREATININE 0.75 03/08/2019   BUN 11 03/08/2019   NA 136 03/08/2019   K 3.6 03/08/2019   CL 100 03/08/2019   CO2 25 03/08/2019   Lab Results  Component Value Date   ALT 14 03/08/2019   AST 18 03/08/2019   ALKPHOS 41 03/08/2019   BILITOT 0.9 03/08/2019   Lab Results  Component Value Date   HGBA1C 5.1 04/06/2019   Lab Results  Component Value Date   INSULIN 8.3 04/06/2019   Lab Results  Component Value Date   TSH 3.090 10/08/2019   No results found for: CHOL, HDL, LDLCALC, LDLDIRECT, TRIG, CHOLHDL Lab Results  Component Value Date   WBC 8.8 03/08/2019   HGB 16.3 (H) 03/08/2019   HCT 45.4 03/08/2019   MCV 84.4 03/08/2019   PLT 283 03/08/2019   No results found for: IRON, TIBC, FERRITIN  Attestation Statements:   Reviewed by clinician on day of visit: allergies, medications, problem list, medical history, surgical history, family history, social history, and previous encounter notes.   Wilhemena Durie, am acting as  transcriptionist for [employer], FNP-C.  I have reviewed the above documentation for accuracy and completeness, and I agree with the above. -  Georgianne Fick, FNP

## 2020-03-08 ENCOUNTER — Other Ambulatory Visit: Payer: Self-pay

## 2020-03-08 ENCOUNTER — Ambulatory Visit (INDEPENDENT_AMBULATORY_CARE_PROVIDER_SITE_OTHER): Payer: BC Managed Care – PPO | Admitting: Family Medicine

## 2020-03-08 ENCOUNTER — Encounter (INDEPENDENT_AMBULATORY_CARE_PROVIDER_SITE_OTHER): Payer: Self-pay | Admitting: Family Medicine

## 2020-03-08 VITALS — BP 107/74 | HR 62 | Temp 98.1°F | Ht 65.0 in | Wt 213.0 lb

## 2020-03-08 DIAGNOSIS — Z9189 Other specified personal risk factors, not elsewhere classified: Secondary | ICD-10-CM | POA: Diagnosis not present

## 2020-03-08 DIAGNOSIS — E786 Lipoprotein deficiency: Secondary | ICD-10-CM

## 2020-03-08 DIAGNOSIS — E559 Vitamin D deficiency, unspecified: Secondary | ICD-10-CM

## 2020-03-08 DIAGNOSIS — E8881 Metabolic syndrome: Secondary | ICD-10-CM

## 2020-03-08 DIAGNOSIS — Z6835 Body mass index (BMI) 35.0-35.9, adult: Secondary | ICD-10-CM

## 2020-03-08 MED ORDER — VITAMIN D (ERGOCALCIFEROL) 1.25 MG (50000 UNIT) PO CAPS: 50000.0000 [IU] | ORAL_CAPSULE | ORAL | 0 refills | Status: DC

## 2020-03-09 LAB — COMPREHENSIVE METABOLIC PANEL
ALT: 13 IU/L (ref 0–32)
AST: 14 IU/L (ref 0–40)
Albumin/Globulin Ratio: 1.3 (ref 1.2–2.2)
Albumin: 3.9 g/dL (ref 3.8–4.8)
Alkaline Phosphatase: 54 IU/L (ref 48–121)
BUN/Creatinine Ratio: 14 (ref 9–23)
BUN: 11 mg/dL (ref 6–24)
Bilirubin Total: 0.6 mg/dL (ref 0.0–1.2)
CO2: 27 mmol/L (ref 20–29)
Calcium: 9 mg/dL (ref 8.7–10.2)
Chloride: 103 mmol/L (ref 96–106)
Creatinine, Ser: 0.79 mg/dL (ref 0.57–1.00)
GFR calc Af Amer: 108 mL/min/{1.73_m2} (ref >59)
GFR calc non Af Amer: 93 mL/min/{1.73_m2} (ref >59)
Globulin, Total: 2.9 g/dL (ref 1.5–4.5)
Glucose: 83 mg/dL (ref 65–99)
Potassium: 4.1 mmol/L (ref 3.5–5.2)
Sodium: 140 mmol/L (ref 134–144)
Total Protein: 6.8 g/dL (ref 6.0–8.5)

## 2020-03-09 LAB — LIPID PANEL WITH LDL/HDL RATIO
Cholesterol, Total: 166 mg/dL (ref 100–199)
HDL: 45 mg/dL (ref >39)
LDL Chol Calc (NIH): 102 mg/dL — ABNORMAL HIGH (ref 0–99)
LDL/HDL Ratio: 2.3 ratio (ref 0.0–3.2)
Triglycerides: 101 mg/dL (ref 0–149)
VLDL Cholesterol Cal: 19 mg/dL (ref 5–40)

## 2020-03-09 LAB — INSULIN, RANDOM: INSULIN: 7.2 u[IU]/mL (ref 2.6–24.9)

## 2020-03-09 LAB — VITAMIN D 25 HYDROXY (VIT D DEFICIENCY, FRACTURES): Vit D, 25-Hydroxy: 39 ng/mL (ref 30.0–100.0)

## 2020-03-09 LAB — HEMOGLOBIN A1C
Est. average glucose Bld gHb Est-mCnc: 103 mg/dL
Hgb A1c MFr Bld: 5.2 % (ref 4.8–5.6)

## 2020-03-10 NOTE — Progress Notes (Signed)
Chief Complaint:   OBESITY Carolyn Shaw is here to discuss her progress with her obesity treatment plan along with follow-up of her obesity related diagnoses. Carolyn Shaw is on the Category 3 Plan or keeping a food journal and adhering to recommended goals of 1400-1600 calories and 90 grams of protein daily and states she is following her eating plan approximately 60% of the time. Carolyn Shaw states she is doing 0 minutes 0 times per week.  Today's visit was #: 92 Starting weight: 219 lbs Starting date: 04/06/2019 Today's weight: 213 lbs Today's date: 03/08/2020 Total lbs lost to date: 6 Total lbs lost since last in-office visit: 0  Interim History: Carolyn Shaw notes being off the plan over this past weekend. She struggles with ideas for meal planning. She is journaling and meeting protein goals but going over on calories.  Subjective:   1. Vitamin D deficiency Carolyn Shaw's Vit D is slightly low at 41.2. She is on weekly prescription Vit D.  2. Insulin resistance Carolyn Shaw has a diagnosis of insulin resistance based on her elevated fasting insulin level >5. She is not on metformin and she denies polyphagia. She continues to work on diet and exercise to decrease her risk of diabetes.  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 She is not on statin. Last LDL and triglycerides was within normal limits.  Lab Results  Component Value Date   ALT 13 03/08/2020   AST 14 03/08/2020   ALKPHOS 54 03/08/2020   BILITOT 0.6 03/08/2020   Lab Results  Component Value Date   CHOL 166 03/08/2020   HDL 45 03/08/2020   LDLCALC 102 (H) 03/08/2020   TRIG 101 03/08/2020   4. At risk for heart disease Carolyn Shaw is at a higher than average risk for cardiovascular disease due to obesity, low HDL, and family history of CAD.   Assessment/Plan:     1. Vitamin D deficiency Low Vitamin D level contributes to fatigue and are associated with obesity, breast, and colon cancer. We will check labs today, and we will refill prescription Vitamin D for 1 month. Carolyn Shaw will follow-up for routine testing of Vitamin D, at least 2-3 times per year to avoid over-replacement.  - VITAMIN D 25 Hydroxy (Vit-D Deficiency, Fractures)  - Vitamin D, Ergocalciferol, (DRISDOL) 1.25 MG (50000 UNIT) CAPS capsule; Take 1 capsule (50,000 Units total) by mouth every 7 (seven) days.  Dispense: 4 capsule; Refill: 0  2. Insulin resistance Carolyn Shaw will continue to work on weight loss, exercise, and decreasing simple carbohydrates to help decrease the risk of diabetes. We will check labs today. Carolyn Shaw agreed to follow-up with [place] as directed to closely monitor her progress.  - Comprehensive metabolic panel - Hemoglobin A1c - Insulin, random  3. 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 We discussed several lifestyle modifications today and Carolyn Shaw will continue to work on diet, exercise and weight loss efforts. We will check labs today.  - Comprehensive metabolic panel - Lipid Panel With LDL/HDL Ratio  4. At risk for heart disease Carolyn Shaw was given approximately 15 minutes of coronary artery disease prevention counseling today. She is 44 y.o. female and has risk factors for heart disease including obesity. We discussed intensive lifestyle modifications today with an emphasis  on specific weight loss instructions and strategies.   Repetitive spaced learning was employed today to elicit superior memory formation and behavioral change.  5. Class 2 severe obesity with serious comorbidity and body mass index (BMI) of 35.0 to 35.9 in adult, unspecified obesity type (HCC) Carolyn Shaw is currently in the action stage of change. As such, her goal is to continue with weight loss efforts. She has agreed to the Category 3 Plan or  keeping a food journal and adhering to recommended goals of 1500-1600 calories and 90 grams of protein daily.   Handout given today: Recipes. We discussed low calories and high protein foods.  Exercise goals: No exercise has been prescribed at this time.  Behavioral modification strategies: increasing lean protein intake, decreasing simple carbohydrates and meal planning and cooking strategies.  Carolyn Shaw has agreed to follow-up with our clinic in 3 weeks. She was informed of the importance of frequent follow-up visits to maximize her success with intensive lifestyle modifications for her multiple health conditions.   Carolyn Shaw was informed we would discuss her lab results at her next visit unless there is a critical issue that needs to be addressed sooner. Carolyn Shaw agreed to keep her next visit at the agreed upon time to discuss these results.  Objective:   Blood pressure 107/74, pulse 62, temperature 98.1 F (36.7 C), temperature source Oral, height 5\' 5"  (1.651 m), weight 213 lb (96.6 kg), last menstrual period 02/25/2020, SpO2 99 %. Body mass index is 35.45 kg/m.  General: Cooperative, alert, well developed, in no acute distress. HEENT: Conjunctivae and lids unremarkable. Cardiovascular: Regular rhythm.  Lungs: Normal work of breathing. Neurologic: No focal deficits.   Lab Results  Component Value Date   CREATININE 0.79 03/08/2020   BUN 11 03/08/2020   NA 140 03/08/2020   K 4.1 03/08/2020   CL 103 03/08/2020   CO2 27 03/08/2020   Lab Results  Component Value Date   ALT 13 03/08/2020   AST 14 03/08/2020   ALKPHOS 54 03/08/2020   BILITOT 0.6 03/08/2020   Lab Results  Component Value Date   HGBA1C 5.2 03/08/2020   HGBA1C 5.1 04/06/2019   Lab Results  Component Value Date   INSULIN 7.2 03/08/2020   INSULIN 8.3 04/06/2019   Lab Results  Component Value Date   TSH 3.090 10/08/2019   Lab Results  Component Value Date   CHOL 166 03/08/2020   HDL 45 03/08/2020   LDLCALC 102  (H) 03/08/2020   TRIG 101 03/08/2020   Lab Results  Component Value Date   WBC 8.8 03/08/2019   HGB 16.3 (H) 03/08/2019   HCT 45.4 03/08/2019   MCV 84.4 03/08/2019   PLT 283 03/08/2019   No results found for: IRON, TIBC, FERRITIN  Attestation Statements:   Reviewed by clinician on day of visit: allergies, medications, problem list, medical history, surgical history, family history, social history, and previous encounter notes.   Wilhemena Durie, am acting as Location manager for [employer], FNP-C.  I have reviewed the above documentation for accuracy and completeness, and I agree with the above. -  Georgianne Fick, FNP

## 2020-03-11 ENCOUNTER — Encounter (INDEPENDENT_AMBULATORY_CARE_PROVIDER_SITE_OTHER): Payer: Self-pay | Admitting: Family Medicine

## 2020-03-11 DIAGNOSIS — E786 Lipoprotein deficiency: Secondary | ICD-10-CM | POA: Insufficient documentation

## 2020-04-04 ENCOUNTER — Ambulatory Visit (INDEPENDENT_AMBULATORY_CARE_PROVIDER_SITE_OTHER): Payer: BC Managed Care – PPO | Admitting: Family Medicine

## 2020-04-04 ENCOUNTER — Other Ambulatory Visit: Payer: Self-pay

## 2020-04-04 ENCOUNTER — Encounter (INDEPENDENT_AMBULATORY_CARE_PROVIDER_SITE_OTHER): Payer: Self-pay | Admitting: Family Medicine

## 2020-04-04 VITALS — BP 99/67 | HR 94 | Temp 98.0°F | Ht 65.0 in | Wt 213.0 lb

## 2020-04-04 DIAGNOSIS — Z9189 Other specified personal risk factors, not elsewhere classified: Secondary | ICD-10-CM | POA: Diagnosis not present

## 2020-04-04 DIAGNOSIS — Z6835 Body mass index (BMI) 35.0-35.9, adult: Secondary | ICD-10-CM

## 2020-04-04 DIAGNOSIS — E559 Vitamin D deficiency, unspecified: Secondary | ICD-10-CM

## 2020-04-04 DIAGNOSIS — E8881 Metabolic syndrome: Secondary | ICD-10-CM

## 2020-04-04 NOTE — Progress Notes (Signed)
Chief Complaint:   OBESITY Carolyn Shaw is here to discuss her progress with her obesity treatment plan along with follow-up of her obesity related diagnoses. Carolyn Shaw is on the Category 3 Plan or keeping a food journal and adhering to recommended goals of 1500-1600 calories and 90 grams of protein daily and states she is following her eating plan approximately 0-10% of the time. Carolyn Shaw states she is at the gym and walking for 60 minutes 2 times per week.  Today's visit was #: 18 Starting weight: 219 lbs Starting date: 04/06/2019 Today's weight: 213 lbs Today's date: 04/04/2020 Total lbs lost to date: 6 Total lbs lost since last in-office visit: 0  Interim History: Carolyn Shaw has been off the plan but she has maintained her weight. She has stopped skipping meals. She is cognizant of protein and is now drinking Fairlife milk to supplement protein intake. .  Subjective:   1. Vitamin D deficiency Carolyn Shaw is not always compliant with Vit D. Vit D is not at goal (39). I discussed labs with the patient today.  2. Insulin resistance Carolyn Shaw has a diagnosis of insulin resistance based on her very mildly elevated fasting insulin level >5. She notes hunger at times. She continues to work on diet and exercise to decrease her risk of diabetes. I discussed labs with the patient today.  Lab Results  Component Value Date   INSULIN 7.2 03/08/2020   INSULIN 8.3 04/06/2019   Lab Results  Component Value Date   HGBA1C 5.2 03/08/2020   3. At risk for diabetes mellitus Carolyn Shaw is at higher than average risk for developing diabetes due to her obesity, family hx.   Assessment/Plan:   1. Vitamin D deficiency Low Vitamin D level contributes to fatigue and are associated with obesity, breast, and colon cancer. Carolyn Shaw agreed to continue taking prescription Vitamin D 50,000 IU every week, and she is to take OTC Vit D3 2,000 IU daily. She will follow-up for routine testing of Vitamin D, at least 2-3 times per year to avoid  over-replacement.   2. Insulin resistance Carolyn Shaw will continue to work on weight loss, exercise, and decreasing simple carbohydrates to help decrease the risk of diabetes. Carolyn Shaw will consider metformin. Carolyn Shaw agreed to follow-up with [place] as directed to closely monitor her progress.  3. At risk for diabetes mellitus Carolyn Shaw was given approximately 15 minutes of diabetes education and counseling today. We discussed intensive lifestyle modifications today with an emphasis on weight loss as well as increasing exercise and decreasing simple carbohydrates in her diet. We also reviewed medication options with an emphasis on risk versus benefit of those discussed.   Repetitive spaced learning was employed today to elicit superior memory formation and behavioral change.  4. Class 2 severe obesity with serious comorbidity and body mass index (BMI) of 35.0 to 35.9 in adult, unspecified obesity type (HCC) Carolyn Shaw is currently in the action stage of change. As such, her goal is to continue with weight loss efforts. She has agreed to keeping a food journal and adhering to recommended goals of 1500-1600 calories and 90 grams of protein daily.   Exercise goals: As is.  Behavioral modification strategies: increasing lean protein intake, decreasing simple carbohydrates and keeping a strict food journal.  Carolyn Shaw has agreed to follow-up with our clinic in 3 weeks. She was informed of the importance of frequent follow-up visits to maximize her success with intensive lifestyle modifications for her multiple health conditions.   Objective:   Blood pressure 99/67, pulse  94, temperature 98 F (36.7 C), temperature source Oral, height 5\' 5"  (1.651 m), weight 213 lb (96.6 kg), SpO2 100 %. Body mass index is 35.45 kg/m.  General: Cooperative, alert, well developed, in no acute distress. HEENT: Conjunctivae and lids unremarkable. Cardiovascular: Regular rhythm.  Lungs: Normal work of breathing. Neurologic: No focal  deficits.   Lab Results  Component Value Date   CREATININE 0.79 03/08/2020   BUN 11 03/08/2020   NA 140 03/08/2020   K 4.1 03/08/2020   CL 103 03/08/2020   CO2 27 03/08/2020   Lab Results  Component Value Date   ALT 13 03/08/2020   AST 14 03/08/2020   ALKPHOS 54 03/08/2020   BILITOT 0.6 03/08/2020   Lab Results  Component Value Date   HGBA1C 5.2 03/08/2020   HGBA1C 5.1 04/06/2019   Lab Results  Component Value Date   INSULIN 7.2 03/08/2020   INSULIN 8.3 04/06/2019   Lab Results  Component Value Date   TSH 3.090 10/08/2019   Lab Results  Component Value Date   CHOL 166 03/08/2020   HDL 45 03/08/2020   LDLCALC 102 (H) 03/08/2020   TRIG 101 03/08/2020   Lab Results  Component Value Date   WBC 8.8 03/08/2019   HGB 16.3 (H) 03/08/2019   HCT 45.4 03/08/2019   MCV 84.4 03/08/2019   PLT 283 03/08/2019   No results found for: IRON, TIBC, FERRITIN  Attestation Statements:   Reviewed by clinician on day of visit: allergies, medications, problem list, medical history, surgical history, family history, social history, and previous encounter notes.   Wilhemena Durie, am acting as Location manager for [employer], FNP-C.  I have reviewed the above documentation for accuracy and completeness, and I agree with the above. -  Georgianne Fick, FNP

## 2020-04-04 NOTE — Progress Notes (Signed)
The 10-year ASCVD risk score Carolyn Shaw., et al., 2013) is: 0.2%   Values used to calculate the score:     Age: 44 years     Sex: Female     Is Non-Hispanic African American: Yes     Diabetic: No     Tobacco smoker: No     Systolic Blood Pressure: 99 mmHg     Is BP treated: No     HDL Cholesterol: 45 mg/dL     Total Cholesterol: 166 mg/dL

## 2020-04-21 ENCOUNTER — Other Ambulatory Visit: Payer: Self-pay | Admitting: Obstetrics and Gynecology

## 2020-04-21 DIAGNOSIS — Z1231 Encounter for screening mammogram for malignant neoplasm of breast: Secondary | ICD-10-CM

## 2020-04-25 ENCOUNTER — Ambulatory Visit (INDEPENDENT_AMBULATORY_CARE_PROVIDER_SITE_OTHER): Payer: BC Managed Care – PPO | Admitting: Family Medicine

## 2020-04-25 ENCOUNTER — Other Ambulatory Visit: Payer: Self-pay

## 2020-04-25 ENCOUNTER — Encounter (INDEPENDENT_AMBULATORY_CARE_PROVIDER_SITE_OTHER): Payer: Self-pay | Admitting: Family Medicine

## 2020-04-25 VITALS — BP 125/73 | HR 80 | Temp 98.6°F | Ht 65.0 in | Wt 211.0 lb

## 2020-04-25 DIAGNOSIS — E8881 Metabolic syndrome: Secondary | ICD-10-CM

## 2020-04-25 DIAGNOSIS — E66812 Obesity, class 2: Secondary | ICD-10-CM

## 2020-04-25 DIAGNOSIS — Z9189 Other specified personal risk factors, not elsewhere classified: Secondary | ICD-10-CM

## 2020-04-25 DIAGNOSIS — F5102 Adjustment insomnia: Secondary | ICD-10-CM | POA: Diagnosis not present

## 2020-04-25 DIAGNOSIS — F3289 Other specified depressive episodes: Secondary | ICD-10-CM | POA: Diagnosis not present

## 2020-04-25 DIAGNOSIS — Z6835 Body mass index (BMI) 35.0-35.9, adult: Secondary | ICD-10-CM

## 2020-04-25 DIAGNOSIS — E88819 Insulin resistance, unspecified: Secondary | ICD-10-CM

## 2020-04-25 MED ORDER — BUPROPION HCL ER (SR) 150 MG PO TB12: 150.0000 mg | ORAL_TABLET | Freq: Every day | ORAL | 0 refills | Status: DC

## 2020-04-25 MED ORDER — MELATONIN 5 MG PO CAPS: 1.0000 | ORAL_CAPSULE | Freq: Every evening | ORAL | 0 refills | Status: DC | PRN

## 2020-04-26 ENCOUNTER — Encounter (INDEPENDENT_AMBULATORY_CARE_PROVIDER_SITE_OTHER): Payer: Self-pay | Admitting: Family Medicine

## 2020-04-26 NOTE — Progress Notes (Signed)
Chief Complaint:   OBESITY Carolyn Shaw is here to discuss her progress with her obesity treatment plan along with follow-up of her obesity related diagnoses. Carolyn Shaw is on keeping a food journal and adhering to recommended goals of 1500-1600 calories and 90 grams of protein daily and states she is following her eating plan approximately 30% of the time. Carolyn Shaw states she is walking 5,000 steps 1 time per week.   Today's visit was #: 80 Starting weight: 219 lbs Starting date: 04/06/2019 Today's weight: 211 lbs Today's date: 04/25/2020 Total lbs lost to date: 8 Total lbs lost since last in-office visit: 2  Interim History: Carolyn Shaw has been eating all 3 meals and she is getting her protein in. She does sometimes have Special K cereal with Fairlife milk for supper. She is journaling only 30% of the time.  Subjective:   1. Other depression, emotional eating [name] notes her cravings are well controlled, and her mood is stable.  2. Adjustment insomnia Carolyn Shaw has been having trouble sleeping since school started back. [name] has been taking 40 mg of melatonin as recommended by a friend which is higher than the recommended dose. She says she slept very well with it.   3. Insulin resistance Carolyn Shaw has a diagnosis of insulin resistance based on her elevated fasting insulin level >5. We discussed metformin and Wegovy again. She notes some polyphagia. She continues to work on diet and exercise to decrease her risk of diabetes.  Lab Results  Component Value Date   INSULIN 7.2 03/08/2020   INSULIN 8.3 04/06/2019   Lab Results  Component Value Date   HGBA1C 5.2 03/08/2020   4. At risk for side effect of medication [place] is at risk of drug side effects secondary to too high of a dose of melatonin.  Assessment/Plan:   1. Other depression, emotional eating Behavior modification techniques were discussed today to help [name] deal with her emotional/non-hunger eating behaviors. We will refill bupropion  for 1 month.  - buPROPion (WELLBUTRIN SR) 150 MG 12 hr tablet; Take 1 tablet (150 mg total) by mouth daily.  Dispense: 30 tablet; Refill: 0  2. Adjustment insomnia The problem of recurrent insomnia was discussed. Orders and follow up as documented in patient record. Counseling: Intensive lifestyle modifications are the first line treatment for this issue. We discussed several lifestyle modifications today.  [name] agreed to take melatonin OTC 5 mg 1 hour before bedtime.  3. Insulin resistance Carolyn Shaw will continue to work on weight loss, exercise, and decreasing simple carbohydrates to help decrease the risk of diabetes. Carolyn Shaw would like to hold off on medications at this point. [name] agreed to follow-up with [place] as directed to closely monitor her progress.  4. At risk for side effect of medication Carolyn Shaw was given approximately 15 minutes of drug side effect counseling today regarding melatonin.  We discussed side effect possibility and risk versus benefits.   Repetitive spaced learning was employed today to elicit superior memory formation and behavioral change.  5. Class 2 severe obesity with serious comorbidity and body mass index (BMI) of 35.0 to 35.9 in adult, unspecified obesity type (HCC) Carolyn Shaw is currently in the action stage of change. As such, her goal is to continue with weight loss efforts. She has agreed to keeping a food journal and adhering to recommended goals of 1500-1600 calories and 90 grams of protein daily.   Carolyn Shaw will journal 5 days out of 7 days per week.  Exercise goals: As is.  Behavioral  modification strategies: increasing lean protein intake and keeping a strict food journal.  Carolyn Shaw has agreed to follow-up with our clinic in 3 weeks.  Objective:   Blood pressure 125/73, pulse 80, temperature 98.6 F (37 C), height 5\' 5"  (1.651 m), weight 211 lb (95.7 kg), last menstrual period 04/08/2020, SpO2 98 %. Body mass index is 35.11 kg/m.  General: Cooperative, alert,  well developed, in no acute distress. HEENT: Conjunctivae and lids unremarkable. Cardiovascular: Regular rhythm.  Lungs: Normal work of breathing. Neurologic: No focal deficits.   Lab Results  Component Value Date   CREATININE 0.79 03/08/2020   BUN 11 03/08/2020   NA 140 03/08/2020   K 4.1 03/08/2020   CL 103 03/08/2020   CO2 27 03/08/2020   Lab Results  Component Value Date   ALT 13 03/08/2020   AST 14 03/08/2020   ALKPHOS 54 03/08/2020   BILITOT 0.6 03/08/2020   Lab Results  Component Value Date   HGBA1C 5.2 03/08/2020   HGBA1C 5.1 04/06/2019   Lab Results  Component Value Date   INSULIN 7.2 03/08/2020   INSULIN 8.3 04/06/2019   Lab Results  Component Value Date   TSH 3.090 10/08/2019   Lab Results  Component Value Date   CHOL 166 03/08/2020   HDL 45 03/08/2020   LDLCALC 102 (H) 03/08/2020   TRIG 101 03/08/2020   Lab Results  Component Value Date   WBC 8.8 03/08/2019   HGB 16.3 (H) 03/08/2019   HCT 45.4 03/08/2019   MCV 84.4 03/08/2019   PLT 283 03/08/2019   No results found for: IRON, TIBC, FERRITIN  Attestation Statements:   Reviewed by clinician on day of visit: allergies, medications, problem list, medical history, surgical history, family history, social history, and previous encounter notes.   Wilhemena Durie, am acting as Location manager for [employer], FNP-C.  I have reviewed the above documentation for accuracy and completeness, and I agree with the above. -  Georgianne Fick, FNP

## 2020-05-16 ENCOUNTER — Other Ambulatory Visit: Payer: Self-pay

## 2020-05-16 ENCOUNTER — Encounter (INDEPENDENT_AMBULATORY_CARE_PROVIDER_SITE_OTHER): Payer: Self-pay | Admitting: Family Medicine

## 2020-05-16 ENCOUNTER — Ambulatory Visit (INDEPENDENT_AMBULATORY_CARE_PROVIDER_SITE_OTHER): Payer: BC Managed Care – PPO | Admitting: Family Medicine

## 2020-05-16 VITALS — BP 109/64 | HR 80 | Temp 98.5°F | Ht 65.0 in | Wt 216.0 lb

## 2020-05-16 DIAGNOSIS — F3289 Other specified depressive episodes: Secondary | ICD-10-CM

## 2020-05-16 DIAGNOSIS — Z9189 Other specified personal risk factors, not elsewhere classified: Secondary | ICD-10-CM | POA: Diagnosis not present

## 2020-05-16 DIAGNOSIS — Z6836 Body mass index (BMI) 36.0-36.9, adult: Secondary | ICD-10-CM

## 2020-05-16 DIAGNOSIS — G4700 Insomnia, unspecified: Secondary | ICD-10-CM | POA: Diagnosis not present

## 2020-05-16 MED ORDER — INSULIN PEN NEEDLE 32G X 4 MM MISC: 0 refills | Status: DC

## 2020-05-16 MED ORDER — SAXENDA 18 MG/3ML ~~LOC~~ SOPN: 3.0000 mg | PEN_INJECTOR | Freq: Every day | SUBCUTANEOUS | 0 refills | Status: DC

## 2020-05-17 ENCOUNTER — Encounter (INDEPENDENT_AMBULATORY_CARE_PROVIDER_SITE_OTHER): Payer: Self-pay | Admitting: Family Medicine

## 2020-05-17 NOTE — Progress Notes (Signed)
Chief Complaint:   OBESITY Carolyn Shaw is here to discuss her progress with her obesity treatment plan along with follow-up of her obesity related diagnoses. Carolyn Shaw is on keeping a food journal and adhering to recommended goals of 1500-1600 calories and 90 grams of protein daily and states she is following her eating plan approximately 60% of the time. Carolyn Shaw states she is walking at school.   Today's visit was #: 20 Starting weight: 219 lbs Starting date: 04/06/2019 Today's weight: 216 lbs Today's date: 05/16/2020 Total lbs lost to date: 3 Total lbs lost since last in-office visit: 0  Interim History: Jearlean notes she has been stress eating fruit snacks due to job stress. She is eating the food on the plan but then craves sweets.  Subjective:   1. Insomnia, unspecified type Carolyn Shaw notes insomnia continues to be a problem due to stress of work. 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 .  2. Other depression, emotional eating Carolyn Shaw is on bupropion and usually feels it helps with cravings. Her mood is stable.  3. At risk for side effect of medication Carolyn Shaw is at risk for drug side effects due to start of Saxenda (nausea, constipation).  Assessment/Plan:   1. Insomnia, unspecified type The problem of recurrent insomnia was discussed.  Counseling: Intensive lifestyle modifications are the first line treatment for this issue. We discussed several lifestyle modifications today. Teneisha will speak to her primary care physician about her insomnia.  2. Other depression, emotional eating Behavior modification techniques were discussed today to help Carolyn Shaw deal with her emotional/non-hunger eating behaviors. Carolyn Shaw will continue bupropion.   3. At risk for side effect of medication Carolyn Shaw was given approximately 15 minutes of drug side effect counseling today due to start of Saxenda which may cause nausea and constipation.  We discussed side effect possibility and risk versus  benefits. Carolyn Shaw agreed to the medication and will contact this office if these side effects are intolerable.  Repetitive spaced learning was employed today to elicit superior memory formation and behavioral change.  4. Class 2 severe obesity with serious comorbidity and body mass index (BMI) of 36.0 to 36.9 in adult, unspecified obesity type (HCC) Carolyn Shaw is currently in the action stage of change. As such, her goal is to continue with weight loss efforts. She has agreed to keeping a food journal and adhering to recommended goals of 1500-1600 calories and 90 grams of protein daily.   We discussed various medication options to help Carolyn Shaw with her weight loss efforts and we both agreed to start Saxenda 3.0 mg SubQ daily with no refills, and nano needles #100 with no refills. Carolyn Shaw denies personal or family history of thyroid cancer, history of pancreatitis, or current cholelithiasis. She was informed of side effects. Patient will start at 0.3 mg SQ daily for 3-4 days and then advance to 0.6 mg SQ daily if no nausea.  - Liraglutide -Weight Management (SAXENDA) 18 MG/3ML SOPN; Inject 3 mg into the skin daily.  Dispense: 15 mL; Refill: 0 - Insulin Pen Needle 32G X 4 MM MISC; Use one needles daily to inject Saxenda.  Dispense: 100 each; Refill: 0  Exercise goals: As is.  Behavioral modification strategies: decreasing simple carbohydrates and increasing water intake.  Carolyn Shaw has agreed to follow-up with our clinic in 3 weeks.   Objective:   Blood pressure 109/64, pulse 80, temperature 98.5 F (36.9 C), temperature source Oral, height 5\' 5"  (1.651 m), weight 216 lb (98 kg), last  menstrual period 05/12/2020, SpO2 95 %. Body mass index is 35.94 kg/m.  General: Cooperative, alert, well developed, in no acute distress. HEENT: Conjunctivae and lids unremarkable. Cardiovascular: Regular rhythm.  Lungs: Normal work of breathing. Neurologic: No focal deficits.   Lab Results  Component Value Date    CREATININE 0.79 03/08/2020   BUN 11 03/08/2020   NA 140 03/08/2020   K 4.1 03/08/2020   CL 103 03/08/2020   CO2 27 03/08/2020   Lab Results  Component Value Date   ALT 13 03/08/2020   AST 14 03/08/2020   ALKPHOS 54 03/08/2020   BILITOT 0.6 03/08/2020   Lab Results  Component Value Date   HGBA1C 5.2 03/08/2020   HGBA1C 5.1 04/06/2019   Lab Results  Component Value Date   INSULIN 7.2 03/08/2020   INSULIN 8.3 04/06/2019   Lab Results  Component Value Date   TSH 3.090 10/08/2019   Lab Results  Component Value Date   CHOL 166 03/08/2020   HDL 45 03/08/2020   LDLCALC 102 (H) 03/08/2020   TRIG 101 03/08/2020   Lab Results  Component Value Date   WBC 8.8 03/08/2019   HGB 16.3 (H) 03/08/2019   HCT 45.4 03/08/2019   MCV 84.4 03/08/2019   PLT 283 03/08/2019   No results found for: IRON, TIBC, FERRITIN  Attestation Statements:   Reviewed by clinician on day of visit: allergies, medications, problem list, medical history, surgical history, family history, social history, and previous encounter notes.   Wilhemena Durie, am acting as Location manager for [employer], FNP-C.  I have reviewed the above documentation for accuracy and completeness, and I agree with the above. -  Georgianne Fick, FNP

## 2020-05-19 ENCOUNTER — Encounter (INDEPENDENT_AMBULATORY_CARE_PROVIDER_SITE_OTHER): Payer: Self-pay

## 2020-05-23 ENCOUNTER — Other Ambulatory Visit: Payer: Self-pay

## 2020-05-23 ENCOUNTER — Ambulatory Visit
Admission: RE | Admit: 2020-05-23 | Discharge: 2020-05-23 | Disposition: A | Discharge status: Home / Self Care | Payer: BC Managed Care – PPO | Source: Ambulatory Visit | Attending: Obstetrics and Gynecology | Admitting: Obstetrics and Gynecology

## 2020-05-23 DIAGNOSIS — Z1231 Encounter for screening mammogram for malignant neoplasm of breast: Secondary | ICD-10-CM

## 2020-06-06 ENCOUNTER — Ambulatory Visit (INDEPENDENT_AMBULATORY_CARE_PROVIDER_SITE_OTHER): Payer: BC Managed Care – PPO | Admitting: Family Medicine

## 2020-06-13 ENCOUNTER — Other Ambulatory Visit: Payer: Self-pay

## 2020-06-13 ENCOUNTER — Encounter (INDEPENDENT_AMBULATORY_CARE_PROVIDER_SITE_OTHER): Payer: Self-pay | Admitting: Family Medicine

## 2020-06-13 ENCOUNTER — Ambulatory Visit (INDEPENDENT_AMBULATORY_CARE_PROVIDER_SITE_OTHER): Payer: BC Managed Care – PPO | Admitting: Family Medicine

## 2020-06-13 VITALS — BP 109/78 | HR 84 | Temp 98.9°F | Ht 65.0 in | Wt 214.0 lb

## 2020-06-13 DIAGNOSIS — E66812 Obesity, class 2: Secondary | ICD-10-CM

## 2020-06-13 DIAGNOSIS — Z9189 Other specified personal risk factors, not elsewhere classified: Secondary | ICD-10-CM

## 2020-06-13 DIAGNOSIS — E8881 Metabolic syndrome: Secondary | ICD-10-CM | POA: Diagnosis not present

## 2020-06-13 DIAGNOSIS — Z6835 Body mass index (BMI) 35.0-35.9, adult: Secondary | ICD-10-CM | POA: Diagnosis not present

## 2020-06-13 DIAGNOSIS — E559 Vitamin D deficiency, unspecified: Secondary | ICD-10-CM

## 2020-06-13 DIAGNOSIS — E88819 Insulin resistance, unspecified: Secondary | ICD-10-CM

## 2020-06-13 MED ORDER — VITAMIN D (ERGOCALCIFEROL) 1.25 MG (50000 UNIT) PO CAPS: 50000.0000 [IU] | ORAL_CAPSULE | ORAL | 0 refills | Status: DC

## 2020-06-14 ENCOUNTER — Encounter (INDEPENDENT_AMBULATORY_CARE_PROVIDER_SITE_OTHER): Payer: Self-pay | Admitting: Family Medicine

## 2020-06-14 NOTE — Progress Notes (Signed)
Chief Complaint:   OBESITY Carolyn Shaw is here to discuss her progress with her obesity treatment plan along with follow-up of her obesity related diagnoses. Carolyn Shaw is keeping a food journal and adhering to recommended goals of 1500-1600 calories and 90 grams of protein and states she is following her eating plan approximately 50% of the time. Carolyn Shaw states she is walking 15 minutes 5 times per week.  Today's visit was #: 21 Starting weight: 219 lbs Starting date: 04/06/2019 Today's weight: 214 lbs Today's date: 06/13/2020 Total lbs lost to date: 5 Total lbs lost since last in-office visit: 2  Interim History: Carolyn Shaw is having a great deal of stress at school. 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 She started Saxenda October 1st and notes some nausea and looser stools at 0.3 mg dose. She has been able to decrease her snacking with the Saxenda.  Subjective:   Vitamin D deficiency. Vitamin D level was low at 39.0 on 03/08/2020. Carolyn Shaw is on weekly prescription Vitamin D supplementation.      Ref. Range 03/08/2020 13:38  Vitamin D, 25-Hydroxy Latest Ref Range: 30.0 - 100.0 ng/mL 39.0   Insulin resistance. Carolyn Shaw has a diagnosis of insulin resistance based on her elevated fasting insulin level >5. Carolyn Shaw is on Saxenda. She denies polyphagia. Last A1c was 7.2.  Lab Results  Component Value Date   INSULIN 7.2 03/08/2020   INSULIN 8.3 04/06/2019   Lab Results  Component Value Date   HGBA1C 5.2 03/08/2020   At risk for side effect of medication. Carolyn Shaw is at risk of side effects from increased dose of Saxenda.  Assessment/Plan:   Vitamin D deficiency.  Vitamin D, Ergocalciferol, (DRISDOL) 1.25 MG (50000 UNIT) CAPS capsule  Insulin resistance. Carolyn Shaw She will continue Saxenda as directed.   At risk for side effect of medication. Carolyn Shaw was given approximately 15 minutes of drug side effect counseling today due to increase in dose of Saxenda.  We discussed side effect  possibility and risk versus benefits. Carolyn Shaw agreed to the medication and will contact this office if these side effects are intolerable.  Repetitive spaced learning was employed today to elicit superior memory formation and behavioral change.  Class 2 severe obesity with serious comorbidity and body mass index (BMI) of 35.0 to 35.9 in adult, unspecified obesity type (Carolyn Shaw). Carolyn Shaw will increase her dose of Saxenda to 0.6 mg daily.  Carolyn Shaw is currently in the action stage of change. As such, her goal is to continue with weight loss efforts. She has agreed to keeping a food journal and adhering to recommended goals of 1500-1600 calories and 90 grams of protein daily.   Exercise goals: Carolyn Shaw will continue her current exercise regimen.   Behavioral modification strategies: increasing lean protein intake, decreasing simple carbohydrates, ways to avoid night time snacking and better snacking choices.  Carolyn Shaw has agreed to follow-up with our clinic in 3 weeks.   Objective:   Blood pressure 109/78, pulse 84, temperature 98.9 F (37.2 C), height 5\' 5"  (1.651 m), weight 214 lb (97.1 kg), SpO2 96 %. Body mass index is 35.61 kg/m.  General: Cooperative, alert, well developed, in no acute distress. HEENT: Conjunctivae and lids unremarkable. Cardiovascular: Regular rhythm.  Lungs: Normal work of breathing. Neurologic: No focal deficits.   Lab Results  Component Value Date   CREATININE 0.79 03/08/2020   BUN 11 03/08/2020   NA 140 03/08/2020   K 4.1 03/08/2020   CL 103 03/08/2020   CO2  27 03/08/2020   Lab Results  Component Value Date   ALT 13 03/08/2020   AST 14 03/08/2020   ALKPHOS 54 03/08/2020   BILITOT 0.6 03/08/2020   Lab Results  Component Value Date   HGBA1C 5.2 03/08/2020   HGBA1C 5.1 04/06/2019   Lab Results  Component Value Date   INSULIN 7.2 03/08/2020   INSULIN 8.3 04/06/2019   Lab Results  Component Value Date   TSH 3.090 10/08/2019   Lab Results  Component Value  Date   CHOL 166 03/08/2020   HDL 45 03/08/2020   LDLCALC 102 (H) 03/08/2020   TRIG 101 03/08/2020   Lab Results  Component Value Date   WBC 8.8 03/08/2019   HGB 16.3 (H) 03/08/2019   HCT 45.4 03/08/2019   MCV 84.4 03/08/2019   PLT 283 03/08/2019   No results found for: IRON, TIBC, FERRITIN  Attestation Statements:   Reviewed by clinician on day of visit: allergies, medications, problem list, medical history, surgical history, family history, social history, and previous encounter notes.  IMichaelene Song, am acting as Location manager for [employer], FNP-C   I have reviewed the above documentation for accuracy and completeness, and I agree with the above. -  Georgianne Fick, FNP

## 2020-07-04 ENCOUNTER — Encounter (INDEPENDENT_AMBULATORY_CARE_PROVIDER_SITE_OTHER): Payer: Self-pay | Admitting: Family Medicine

## 2020-07-04 ENCOUNTER — Other Ambulatory Visit: Payer: Self-pay

## 2020-07-04 ENCOUNTER — Ambulatory Visit (INDEPENDENT_AMBULATORY_CARE_PROVIDER_SITE_OTHER): Payer: BC Managed Care – PPO | Admitting: Family Medicine

## 2020-07-04 VITALS — BP 109/75 | HR 77 | Temp 98.3°F | Ht 65.0 in | Wt 218.0 lb

## 2020-07-04 DIAGNOSIS — Z6836 Body mass index (BMI) 36.0-36.9, adult: Secondary | ICD-10-CM

## 2020-07-04 DIAGNOSIS — F3289 Other specified depressive episodes: Secondary | ICD-10-CM

## 2020-07-04 DIAGNOSIS — R110 Nausea: Secondary | ICD-10-CM | POA: Diagnosis not present

## 2020-07-04 DIAGNOSIS — Z9189 Other specified personal risk factors, not elsewhere classified: Secondary | ICD-10-CM | POA: Diagnosis not present

## 2020-07-04 MED ORDER — ONDANSETRON 4 MG PO TBDP: 4.0000 mg | ORAL_TABLET | Freq: Three times a day (TID) | ORAL | 0 refills | Status: DC | PRN

## 2020-07-05 NOTE — Progress Notes (Signed)
Chief Complaint:   OBESITY Carolyn Shaw is here to discuss her progress with her obesity treatment plan along with follow-up of her obesity related diagnoses. Carolyn Shaw is on keeping a food journal and adhering to recommended goals of 1500-1600 calories and 90 grams of protein daily and states she is following her eating plan approximately 60% of the time. Carolyn Shaw states she is walking around at work.   Today's visit was #: 22 Starting weight: 219 lbs Starting date: 04/06/2019 Today's weight: 218 lbs Today's date: 07/04/2020 Total lbs lost to date: 1 Total lbs lost since last in-office visit: 0  Interim History: Navdeep notes nausea and reflux with Saxenda 0.6 mg. However she does feels it helps with appetite, and she would like to continue it. She has long-standing GERD symptoms. She notes significant stress eating due to stressful work environment. . She is journaling 2 days per week.  Subjective:   1. Nausea Diamantina notes nausea and reflux with Saxenda. She has long-standing GERD issues and she does not believe Kirke Shaggy is the only cause.   2. Other depression, emotional eating Miosotis notes significant stress eating and carbohydrate cravings. She feels her mood could be better. She notes depression, but denies suicidal or homicidal ideas. She has a history of depression, anxiety, and ADHD.  3. At risk for malnutrition Alta is at increased risk for malnutrition due to inadequate protein intake.  Assessment/Plan:   1. Nausea Donnajean agreed to start Zofran 4 mg every 8 hours as needed for nausea, with no refills. She is to avoid trigger foods.  - ondansetron (ZOFRAN ODT) 4 MG disintegrating tablet; Take 1 tablet (4 mg total) by mouth every 8 (eight) hours as needed for nausea or vomiting.  Dispense: 20 tablet; Refill: 0  2. Other depression, emotional eating Aleka may increase dose of bupropion BID, no refill needed.   3. At risk for malnutrition Casilda was given approximately 15 minutes of  counseling today regarding prevention of malnutrition and ways to meet macronutrient goals..   4. Class 2 severe obesity with serious comorbidity and body mass index (BMI) of 36.0 to 36.9 in adult, unspecified obesity type (HCC) Gaylynn is currently in the action stage of change. As such, her goal is to continue with weight loss efforts. She has agreed to the Category 3 Plan.   We discussed various medication options to help Janellie with her weight loss efforts and we both agreed to continue Saxenda at 0.6 mg daily.  Exercise goals: As is.  Behavioral modification strategies: increasing lean protein intake and no skipping meals.  Maille has agreed to follow-up with our clinic in 3 weeks.   Objective:   Blood pressure 109/75, pulse 77, temperature 98.3 F (36.8 C), height 5\' 5"  (1.651 m), weight 218 lb (98.9 kg), last menstrual period 07/02/2020, SpO2 97 %. Body mass index is 36.28 kg/m.  General: Cooperative, alert, well developed, in no acute distress. HEENT: Conjunctivae and lids unremarkable. Cardiovascular: Regular rhythm.  Lungs: Normal work of breathing. Neurologic: No focal deficits.   Lab Results  Component Value Date   CREATININE 0.79 03/08/2020   BUN 11 03/08/2020   NA 140 03/08/2020   K 4.1 03/08/2020   CL 103 03/08/2020   CO2 27 03/08/2020   Lab Results  Component Value Date   ALT 13 03/08/2020   AST 14 03/08/2020   ALKPHOS 54 03/08/2020   BILITOT 0.6 03/08/2020   Lab Results  Component Value Date   HGBA1C 5.2 03/08/2020  HGBA1C 5.1 04/06/2019   Lab Results  Component Value Date   INSULIN 7.2 03/08/2020   INSULIN 8.3 04/06/2019   Lab Results  Component Value Date   TSH 3.090 10/08/2019   Lab Results  Component Value Date   CHOL 166 03/08/2020   HDL 45 03/08/2020   LDLCALC 102 (H) 03/08/2020   TRIG 101 03/08/2020   Lab Results  Component Value Date   WBC 8.8 03/08/2019   HGB 16.3 (H) 03/08/2019   HCT 45.4 03/08/2019   MCV 84.4 03/08/2019    PLT 283 03/08/2019   No results found for: IRON, TIBC, FERRITIN  Attestation Statements:   Reviewed by clinician on day of visit: allergies, medications, problem list, medical history, surgical history, family history, social history, and previous encounter notes.   Wilhemena Durie, am acting as Location manager for [employer], FNP-C.  I have reviewed the above documentation for accuracy and completeness, and I agree with the above. -  Georgianne Fick, FNP

## 2020-07-06 ENCOUNTER — Encounter (INDEPENDENT_AMBULATORY_CARE_PROVIDER_SITE_OTHER): Payer: Self-pay | Admitting: Family Medicine

## 2020-07-25 NOTE — Progress Notes (Signed)
NEUROLOGY FOLLOW UP OFFICE NOTE  Carolyn Shaw [id number]   Subjective:  Carolyn Shaw is a 44 year old left-handed woman with migraines, uterine fibroids, allergic rhinitis and GERD who follows up for migraines.  UPDATE: Intensity:  5/10 Duration:  Up to 2 hours with Maxalt or Excedrin Frequency:  2 to 3 days a month (usually right before cycle, during and maybe a dull one afterwards) Frequency of abortive medication:  1 to 2 days a month Rescue protocol: Excedrin first line, Maxalt 2nd line. Current NSAIDS:None Current analgesics:Excedrin Current triptans:Maxalt 10 mg Current ergotamine:None Current anti-emetic:None Current muscle relaxants:None Current anti-anxiolytic:None Current sleep aide:None Current Antihypertensive medications:None Current Antidepressant medications:None Current Anticonvulsant medications:Topiramate 50 mg daily Current anti-CGRP:None Current Vitamins/Herbal/Supplements:None Current Antihistamines/Decongestants:None Other therapy:None Hormone/birth control:No  Caffeine:Very little Diet:Hydrates.  Does skip meals.   Depression:No; Anxiety:Yes Other pain:No Sleep hygiene:Okay  HISTORY: Onset:  Adolescence but formally diagnosed in 2002 Location:Left periorbital region Quality:Squeezing sensation of the eye followed by stabbing and pounding around the eye. Initial Intensity:7-8/10 Associated symptoms:photophobia, nausea, phonophobia.No osmophobia, eye lacrimation or nasal congestion. Aura:Visual field loss from the left moving to the right side leaving a left field cut briefly Initial Duration:All day without medication (1-3 hours with medication) Initial Frequency:Twice a week Activity:Lay down but able to force self to be active if needed Triggers/aggravating factors:  Bright lights, loud noise, menstrual cycle Relieving factors:Laying down.  Past NSAIDs: Ibuprofen, naproxen Past  analgesics: Excedrin, Tylenol Past preventative therapy:none  Family history:No history of headache.  PAST MEDICAL HISTORY: Past Medical History:  Diagnosis Date  . Anxiety   . Back pain   . Chest pain   . Constipation   . Depression   . Fibroids   . Food allergy   . GERD (gastroesophageal reflux disease)   . Lactose intolerance   . Migraines   . Seasonal allergies   . Vitamin D deficiency     MEDICATIONS: Current Outpatient Medications on File Prior to Visit  Medication Sig Dispense Refill  . aspirin-acetaminophen-caffeine (EXCEDRIN MIGRAINE) 250-250-65 MG per tablet Take by mouth every 6 (six) hours as needed for headache or migraine.    Marland Kitchen buPROPion (WELLBUTRIN SR) 150 MG 12 hr tablet Take 1 tablet (150 mg total) by mouth daily. 30 tablet 0  . Cholecalciferol 50 MCG (2000 UT) CAPS Take 1 capsule by mouth daily. OTC    . famotidine (PEPCID) 20 MG tablet Take 1 tablet (20 mg total) by mouth 2 (two) times daily. 30 tablet 0  . ibuprofen (ADVIL,MOTRIN) 800 MG tablet Take 800 mg by mouth every 8 (eight) hours as needed.    . Insulin Pen Needle 32G X 4 MM MISC Use one needles daily to inject Saxenda. 100 each 0  . Liraglutide -Weight Management (SAXENDA) 18 MG/3ML SOPN Inject 3 mg into the skin daily. 15 mL 0  . Melatonin 5 MG CAPS Take 1 capsule (5 mg total) by mouth at bedtime as needed. 30 capsule 0  . Multiple Vitamin (MULTIVITAMIN) tablet Take 1 tablet by mouth daily.    . ondansetron (ZOFRAN ODT) 4 MG disintegrating tablet Take 1 tablet (4 mg total) by mouth every 8 (eight) hours as needed for nausea or vomiting. 20 tablet 0  . rizatriptan (MAXALT) 10 MG tablet Take 1 tablet (10 mg total) by mouth as needed for migraine. May repeat in 2 hours if needed 30 tablet 8  . sucralfate (CARAFATE) 1 g tablet Take 1 tablet (1 g total) by mouth 4 (four) times daily -  with meals and at bedtime. 60 tablet 1  . topiramate (TOPAMAX) 50 MG tablet Take 1 tablet (50 mg total) by mouth at  bedtime. 90 tablet 3  . Vitamin D, Ergocalciferol, (DRISDOL) 1.25 MG (50000 UNIT) CAPS capsule Take 1 capsule (50,000 Units total) by mouth every 7 (seven) days. 4 capsule 0   No current facility-administered medications on file prior to visit.    ALLERGIES: Allergies  Allergen Reactions  . Chocolate Hives  . Erythromycin Nausea And Vomiting    FAMILY HISTORY: Family History  Problem Relation Age of Onset  . Diabetes Mother   . High blood pressure Mother   . Thyroid disease Mother   . Sleep apnea Mother   . Obesity Mother   . Diabetes Father   . High blood pressure Father   . Stroke Father   . Drug abuse Father   . Obesity Father   . Ataxia Neg Hx   . Chorea Neg Hx   . Dementia Neg Hx   . Mental retardation Neg Hx   . Migraines Neg Hx   . Multiple sclerosis Neg Hx   . Neurofibromatosis Neg Hx   . Neuropathy Neg Hx   . Parkinsonism Neg Hx   . Seizures Neg Hx     SOCIAL HISTORY: Social History   Socioeconomic History  . Marital status: Single    Spouse name: Not on file  . Number of children: 0  . Years of education: Not on file  . Highest education level: Master's degree (e.g., MA, MS, MEng, MEd, MSW, MBA)  Occupational History  . 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 Smoking status: Never Smoker  . Smokeless tobacco: Never Used  Vaping Use  . Vaping Use: Never used  Substance and Sexual Activity  . Alcohol use: No    Alcohol/week: 0.0 standard drinks  . Drug use: No  . Sexual activity: Yes    Partners: Male  Other Topics Concern  . Not on file  Social History Narrative   Pt is single she lives alone   Has no children   Left handed   Master Degreed   Drinks tea, no coffee, no soda   Social Determinants of Health   Financial Resource Strain:   . Difficulty of Paying Living Expenses: Not on file  Food Insecurity:   . Worried About Charity fundraiser in the Last Year: Not on file  . Ran Out of Food in the Last Year: Not on  file  Transportation Needs:   . Lack of Transportation (Medical): Not on file  . Lack of Transportation (Non-Medical): Not on file  Physical Activity:   . Days of Exercise per Week: Not on file  . Minutes of Exercise per Session: Not on file  Stress:   . Feeling of Stress : Not on file  Social Connections:   . Frequency of Communication with Friends and Family: Not on file  . Frequency of Social Gatherings with Friends and Family: Not on file  . Attends Religious Services: Not on file  . Active Member of Clubs or Organizations: Not on file  . Attends Archivist Meetings: Not on file  . Marital Status: Not on file  Intimate Partner Violence:   . Fear of Current or Ex-Partner: Not on file  . Emotionally Abused: Not on file  . Physically Abused: Not on file  . Sexually Abused: Not on file     Objective:  Blood pressure 125/89, pulse  83, height 5\' 5"  (1.651 m), weight 219 lb (99.3 kg), last menstrual period 07/02/2020, SpO2 97 %. General: No acute distress.  Patient appears well-groomed.   Head:  Normocephalic/atraumatic Eyes:  Fundi examined but not visualized Neck: supple, no paraspinal tenderness, full range of motion Heart:  Regular rate and rhythm Lungs:  Clear to auscultation bilaterally Back: No paraspinal tenderness Neurological Exam: alert and oriented to person, place, and time. Attention span and concentration intact, recent and remote memory intact, fund of knowledge intact.  Speech fluent and not dysarthric, language intact.  CN II-XII intact. Bulk and tone normal, muscle strength 5/5 throughout.  Sensation to light touch, temperature and vibration intact.  Deep tendon reflexes 2+ throughout, toes downgoing.  Finger to nose and heel to shin testing intact.  Gait normal, Romberg negative.   Assessment/Plan:   Migraine with aura, without status migrainosus, not intractable.  1.  For preventative management, topiramate 50mg  at bedtime 2.  For abortive therapy,  Excedrin and/or Maxalt 10mg  3.  Limit use of pain relievers to no more than 2 days out of week to prevent risk of rebound or medication-overuse headache. 4.  Keep headache diary 5.  Exercise, hydration, caffeine cessation, sleep hygiene, monitor for and avoid triggers 6.  Follow up 1 year   Metta Clines, DO  CC: Donald Prose, MD

## 2020-07-26 ENCOUNTER — Other Ambulatory Visit: Payer: Self-pay

## 2020-07-26 ENCOUNTER — Encounter: Payer: Self-pay | Admitting: Neurology

## 2020-07-26 ENCOUNTER — Ambulatory Visit: Payer: BC Managed Care – PPO | Admitting: Neurology

## 2020-07-26 VITALS — BP 125/89 | HR 83 | Ht 65.0 in | Wt 219.0 lb

## 2020-07-26 DIAGNOSIS — G43109 Migraine with aura, not intractable, without status migrainosus: Secondary | ICD-10-CM | POA: Diagnosis not present

## 2020-07-26 MED ORDER — RIZATRIPTAN BENZOATE 10 MG PO TABS: 10.0000 mg | ORAL_TABLET | ORAL | 5 refills | Status: DC | PRN

## 2020-07-26 NOTE — Patient Instructions (Signed)
  1. Topiramate 50mg  at bedtime 2. Take rizatriptan 10mg  at earliest onset of headache.  May repeat dose once in 2 hours if needed.  Maximum 2 tablets in 24 hours. 3. Limit use of pain relievers to no more than 2 days out of the week.  These medications include acetaminophen, NSAIDs (ibuprofen/Advil/Motrin, naproxen/Aleve, triptans (Imitrex/sumatriptan), Excedrin, and narcotics.  This will help reduce risk of rebound headaches. 4. Be aware of common food triggers:  - Caffeine:  coffee, black tea, cola, Mt. Dew  - Chocolate  - Dairy:  aged cheeses (brie, blue, cheddar, gouda, [place], provolone, [place], Swiss, etc), chocolate milk, buttermilk, sour cream, limit eggs and yogurt  - Nuts, peanut butter  - Alcohol  - Cereals/grains:  FRESH breads (fresh bagels, sourdough, doughnuts), yeast productions  - Processed/canned/aged/cured meats (pre-packaged deli meats, hotdogs)  - MSG/glutamate:  soy sauce, flavor enhancer, pickled/preserved/marinated foods  - Sweeteners:  aspartame (Equal, Nutrasweet).  Sugar and Splenda are okay  - Vegetables:  legumes (lima beans, lentils, snow peas, fava beans, pinto peans, peas, garbanzo beans), sauerkraut, onions, olives, pickles  - Fruit:  avocados, bananas, citrus fruit (orange, lemon, grapefruit), mango  - Other:  Frozen meals, macaroni and cheese 5. Routine exercise 6. Stay adequately hydrated (aim for 64 oz water daily) 7. Keep headache diary 8. Maintain proper stress management 9. Maintain proper sleep hygiene 10. Do not skip meals 11. Consider supplements:  magnesium citrate 400mg  daily, riboflavin 400mg  daily, coenzyme Q10 100mg  three times daily.

## 2020-07-28 ENCOUNTER — Encounter (INDEPENDENT_AMBULATORY_CARE_PROVIDER_SITE_OTHER): Payer: Self-pay | Admitting: Family Medicine

## 2020-07-28 ENCOUNTER — Ambulatory Visit (INDEPENDENT_AMBULATORY_CARE_PROVIDER_SITE_OTHER): Payer: BC Managed Care – PPO | Admitting: Family Medicine

## 2020-07-28 ENCOUNTER — Other Ambulatory Visit: Payer: Self-pay

## 2020-07-28 VITALS — BP 118/74 | HR 79 | Temp 98.1°F | Ht 65.0 in | Wt 217.0 lb

## 2020-07-28 DIAGNOSIS — E559 Vitamin D deficiency, unspecified: Secondary | ICD-10-CM

## 2020-07-28 DIAGNOSIS — Z9189 Other specified personal risk factors, not elsewhere classified: Secondary | ICD-10-CM | POA: Diagnosis not present

## 2020-07-28 DIAGNOSIS — Z6836 Body mass index (BMI) 36.0-36.9, adult: Secondary | ICD-10-CM

## 2020-07-28 DIAGNOSIS — E8881 Metabolic syndrome: Secondary | ICD-10-CM | POA: Diagnosis not present

## 2020-07-28 MED ORDER — VITAMIN D (ERGOCALCIFEROL) 1.25 MG (50000 UNIT) PO CAPS: 50000.0000 [IU] | ORAL_CAPSULE | ORAL | 0 refills | Status: DC

## 2020-07-28 NOTE — Progress Notes (Signed)
Chief Complaint:   OBESITY Carolyn Shaw is here to discuss her progress with her obesity treatment plan along with follow-up of her obesity related diagnoses. Carolyn Shaw is on the Category 3 Plan and states she is following her eating plan approximately 40% of the time. Carolyn Shaw states she is not exercising at this time.  Today's visit was #: 23 Starting weight: 219 lbs Starting date: 04/06/2019 Today's weight: 217 lbs Today's date: 07/28/2020 Total lbs lost to date: 2 lbs Total lbs lost since last in-office visit: 1 lb  Interim History: Carolyn Shaw has been out of town for a funeral and notes eating was off plan over the last 1.5 weeks.  She notes that she eats poorly when with family (too many carbs).  She has been protein deficient the past few weeks.  She notes some constipation with Saxenda.  Subjective:   1. Insulin resistance She endorses mild polyphagia.  She is taking [place] daily.  Lab Results  Component Value Date   INSULIN 7.2 03/08/2020   INSULIN 8.3 04/06/2019   Lab Results  Component Value Date   HGBA1C 5.2 03/08/2020   2. Vitamin D deficiency Vitamin D level is low at 39.0.  She is on prescription vitamin D.  3. At risk for side effect of medication Carolyn Shaw is at risk for constipation due to increasing her dose of Saxenda.  Assessment/Plan:   1. Insulin resistance Explained insulin resistance in depth.  2. Vitamin D deficiency Will refill vitamin D, as per below.  Will recheck level in 6 weeks.  -Refill Vitamin D, Ergocalciferol, (DRISDOL) 1.25 MG (50000 UNIT) CAPS capsule; Take 1 capsule (50,000 Units total) by mouth every 7 (seven) days.  Dispense: 4 capsule; Refill: 0  3. At risk for side effect of medication Carolyn Shaw was given approximately 15 minutes of drug side effect counseling today.  We discussed side effect possibility and risk versus benefits. Carolyn Shaw agreed to the medication and will contact this office if these side effects are intolerable. I recommended  metamucil plus Miralax and plenty of water.   Repetitive spaced learning was employed today to elicit superior memory formation and behavioral change.  4. Class 2 severe obesity with serious comorbidity and body mass index (BMI) of 36.0 to 36.9 in adult, unspecified obesity type (HCC)  Carolyn Shaw is currently in the action stage of change. As such, her goal is to continue with weight loss efforts. She has agreed to the Category 3 Plan.   Increase Saxenda dose to 0.9 mg subcutaneously daily.  Exercise goals: All adults should avoid inactivity. Some physical activity is better than none, and adults who participate in any amount of physical activity gain some health benefits.  Behavioral modification strategies: increasing lean protein intake and decreasing simple carbohydrates.  Carolyn Shaw has agreed to follow-up with our clinic in 3 weeks  Objective:   Blood pressure 118/74, pulse 79, temperature 98.1 F (36.7 C), height 5\' 5"  (1.651 m), weight 217 lb (98.4 kg), last menstrual period 07/26/2020, SpO2 96 %. Body mass index is 36.11 kg/m.  General: Cooperative, alert, well developed, in no acute distress. HEENT: Conjunctivae and lids unremarkable. Cardiovascular: Regular rhythm.  Lungs: Normal work of breathing. Neurologic: No focal deficits.   Lab Results  Component Value Date   CREATININE 0.79 03/08/2020   BUN 11 03/08/2020   NA 140 03/08/2020   K 4.1 03/08/2020   CL 103 03/08/2020   CO2 27 03/08/2020   Lab Results  Component Value Date   ALT 13  03/08/2020   AST 14 03/08/2020   ALKPHOS 54 03/08/2020   BILITOT 0.6 03/08/2020   Lab Results  Component Value Date   HGBA1C 5.2 03/08/2020   HGBA1C 5.1 04/06/2019   Lab Results  Component Value Date   INSULIN 7.2 03/08/2020   INSULIN 8.3 04/06/2019   Lab Results  Component Value Date   TSH 3.090 10/08/2019   Lab Results  Component Value Date   CHOL 166 03/08/2020   HDL 45 03/08/2020   LDLCALC 102 (H) 03/08/2020   TRIG 101  03/08/2020   Lab Results  Component Value Date   WBC 8.8 03/08/2019   HGB 16.3 (H) 03/08/2019   HCT 45.4 03/08/2019   MCV 84.4 03/08/2019   PLT 283 03/08/2019   Attestation Statements:   Reviewed by clinician on day of visit: allergies, medications, problem list, medical history, surgical history, family history, social history, and previous encounter notes.  I, Water quality scientist, CMA, am acting as Location manager for [employer], [place].  I have reviewed the above documentation for accuracy and completeness, and I agree with the above. -  Georgianne Fick, FNP

## 2020-08-16 ENCOUNTER — Encounter (INDEPENDENT_AMBULATORY_CARE_PROVIDER_SITE_OTHER): Payer: Self-pay | Admitting: Family Medicine

## 2020-08-16 ENCOUNTER — Other Ambulatory Visit: Payer: Self-pay

## 2020-08-16 ENCOUNTER — Ambulatory Visit (INDEPENDENT_AMBULATORY_CARE_PROVIDER_SITE_OTHER): Payer: BC Managed Care – PPO | Admitting: Family Medicine

## 2020-08-16 VITALS — BP 119/72 | HR 71 | Temp 98.0°F | Ht 65.0 in | Wt 215.0 lb

## 2020-08-16 DIAGNOSIS — Z9189 Other specified personal risk factors, not elsewhere classified: Secondary | ICD-10-CM

## 2020-08-16 DIAGNOSIS — Z6835 Body mass index (BMI) 35.0-35.9, adult: Secondary | ICD-10-CM | POA: Diagnosis not present

## 2020-08-16 DIAGNOSIS — F3289 Other specified depressive episodes: Secondary | ICD-10-CM | POA: Diagnosis not present

## 2020-08-16 DIAGNOSIS — E559 Vitamin D deficiency, unspecified: Secondary | ICD-10-CM | POA: Diagnosis not present

## 2020-08-16 MED ORDER — VITAMIN D (ERGOCALCIFEROL) 1.25 MG (50000 UNIT) PO CAPS: 50000.0000 [IU] | ORAL_CAPSULE | ORAL | 0 refills | Status: DC

## 2020-08-16 MED ORDER — BUPROPION HCL ER (SR) 150 MG PO TB12: 150.0000 mg | ORAL_TABLET | Freq: Every day | ORAL | 0 refills | Status: DC

## 2020-08-16 NOTE — Progress Notes (Signed)
Chief Complaint:   OBESITY Carolyn Shaw is here to discuss her progress with her obesity treatment plan along with follow-up of her obesity related diagnoses. Carolyn Shaw is on the Category 3 Plan and states she is following her eating plan approximately 50% of the time. Carolyn Shaw states she is not regularly exercising at this time.  Today's visit was #: 24 Starting weight: 219 lbs Starting date: 04/06/2019 Today's weight: 215 lbs Today's date: 08/16/2020 Total lbs lost to date: 4 lbs Total lbs lost since last in-office visit: 2 lbs  Interim History: Carolyn Shaw is on 0.9 mg Saxenda daily.  Appetite is well controlled.  She is skipping occasional meals.  She is eating too many carbs and not enough protein.  Has mild constipation that is relieved by Benefiber.  Subjective:   1. Vitamin D deficiency Vitamin D is low at 42.  She is on weekly prescription vitamin D.  2. Other depression, emotional eating Mood is stable.  Cravings are well controlled. She is on bupropion.   3. At risk for impaired metabolic function Carolyn Shaw is at increased risk for impaired metabolic function due to meal skipping and inadequate protein intake.  Assessment/Plan:   1. Vitamin D deficiency Will refill vitamin D 50,000 IU weekly.  Check vitamin D level at next office visit.  - Vitamin D, Ergocalciferol, (DRISDOL) 1.25 MG (50000 UNIT) CAPS capsule; Take 1 capsule (50,000 Units total) by mouth every 7 (seven) days.  Dispense: 4 capsule; Refill: 0  2. Other depression, emotional eating Refill bupropion 150 mg today.  - buPROPion (WELLBUTRIN SR) 150 MG 12 hr tablet; Take 1 tablet (150 mg total) by mouth daily.  Dispense: 30 tablet; Refill: 0  3. At risk for impaired metabolic function Carolyn Shaw was given approximately 15 minutes of impaired  metabolic function prevention counseling today. We discussed intensive lifestyle modifications today with an emphasis on specific nutrition and exercise instructions and strategies.    Repetitive spaced learning was employed today to elicit superior memory formation and behavioral change.  4. Class 2 severe obesity with serious comorbidity and body mass index (BMI) of 35.0 to 35.9 in adult, unspecified obesity type (HCC)  Carolyn Shaw is currently in the action stage of change. As such, her goal is to continue with weight loss efforts. She has agreed to keeping a food journal and adhering to recommended goals of 1400-1600 calories and 90 grams of protein.   Continue Saxenda 0.9 mg subcutaneously daily.  Exercise goals: Go to [employer] 1 time per week.  Behavioral modification strategies: increasing lean protein intake and no skipping meals.  Carolyn Shaw has agreed to follow-up with our clinic in 3 weeks, fasting.   Objective:   Blood pressure 119/72, pulse 71, temperature 98 F (36.7 C), height 5\' 5"  (1.651 m), weight 215 lb (97.5 kg), last menstrual period 07/26/2020, SpO2 98 %. Body mass index is 35.78 kg/m.  General: Cooperative, alert, well developed, in no acute distress. HEENT: Conjunctivae and lids unremarkable. Cardiovascular: Regular rhythm.  Lungs: Normal work of breathing. Neurologic: No focal deficits.   Lab Results  Component Value Date   CREATININE 0.79 03/08/2020   BUN 11 03/08/2020   NA 140 03/08/2020   K 4.1 03/08/2020   CL 103 03/08/2020   CO2 27 03/08/2020   Lab Results  Component Value Date   ALT 13 03/08/2020   AST 14 03/08/2020   ALKPHOS 54 03/08/2020   BILITOT 0.6 03/08/2020   Lab Results  Component Value Date   HGBA1C  5.2 03/08/2020   HGBA1C 5.1 04/06/2019   Lab Results  Component Value Date   INSULIN 7.2 03/08/2020   INSULIN 8.3 04/06/2019   Lab Results  Component Value Date   TSH 3.090 10/08/2019   Lab Results  Component Value Date   CHOL 166 03/08/2020   HDL 45 03/08/2020   LDLCALC 102 (H) 03/08/2020   TRIG 101 03/08/2020   Lab Results  Component Value Date   WBC 8.8 03/08/2019   HGB 16.3 (H) 03/08/2019    HCT 45.4 03/08/2019   MCV 84.4 03/08/2019   PLT 283 03/08/2019   Attestation Statements:   Reviewed by clinician on day of visit: allergies, medications, problem list, medical history, surgical history, family history, social history, and previous encounter notes.  I, Water quality scientist, CMA, am acting as Location manager for [employer], [place].  I have reviewed the above documentation for accuracy and completeness, and I agree with the above. -  Georgianne Fick, FNP

## 2020-08-18 ENCOUNTER — Other Ambulatory Visit: Payer: BC Managed Care – PPO

## 2020-08-18 DIAGNOSIS — Z20822 Contact with and (suspected) exposure to covid-19: Secondary | ICD-10-CM

## 2020-08-20 LAB — NOVEL CORONAVIRUS, NAA: SARS-CoV-2, NAA: NOT DETECTED

## 2020-08-20 LAB — SARS-COV-2, NAA 2 DAY TAT

## 2020-08-29 ENCOUNTER — Other Ambulatory Visit: Payer: BC Managed Care – PPO

## 2020-08-29 DIAGNOSIS — Z20822 Contact with and (suspected) exposure to covid-19: Secondary | ICD-10-CM

## 2020-08-31 LAB — NOVEL CORONAVIRUS, NAA: SARS-CoV-2, NAA: NOT DETECTED

## 2020-08-31 LAB — SARS-COV-2, NAA 2 DAY TAT

## 2020-09-05 ENCOUNTER — Ambulatory Visit (INDEPENDENT_AMBULATORY_CARE_PROVIDER_SITE_OTHER): Payer: BC Managed Care – PPO | Admitting: Family Medicine

## 2020-09-07 ENCOUNTER — Ambulatory Visit (INDEPENDENT_AMBULATORY_CARE_PROVIDER_SITE_OTHER): Payer: BC Managed Care – PPO | Admitting: Family Medicine

## 2020-09-07 ENCOUNTER — Encounter (INDEPENDENT_AMBULATORY_CARE_PROVIDER_SITE_OTHER): Payer: Self-pay | Admitting: Family Medicine

## 2020-09-07 ENCOUNTER — Other Ambulatory Visit: Payer: Self-pay

## 2020-09-07 VITALS — BP 101/68 | HR 75 | Temp 98.1°F | Ht 65.0 in | Wt 215.0 lb

## 2020-09-07 DIAGNOSIS — E8881 Metabolic syndrome: Secondary | ICD-10-CM

## 2020-09-07 DIAGNOSIS — Z9189 Other specified personal risk factors, not elsewhere classified: Secondary | ICD-10-CM | POA: Diagnosis not present

## 2020-09-07 DIAGNOSIS — E7849 Other hyperlipidemia: Secondary | ICD-10-CM

## 2020-09-07 DIAGNOSIS — E88819 Insulin resistance, unspecified: Secondary | ICD-10-CM

## 2020-09-07 DIAGNOSIS — E559 Vitamin D deficiency, unspecified: Secondary | ICD-10-CM | POA: Diagnosis not present

## 2020-09-07 DIAGNOSIS — Z6835 Body mass index (BMI) 35.0-35.9, adult: Secondary | ICD-10-CM

## 2020-09-08 LAB — LIPID PANEL WITH LDL/HDL RATIO
Cholesterol, Total: 174 mg/dL (ref 100–199)
HDL: 51 mg/dL (ref >39)
LDL Chol Calc (NIH): 110 mg/dL — ABNORMAL HIGH (ref 0–99)
LDL/HDL Ratio: 2.2 ratio (ref 0.0–3.2)
Triglycerides: 68 mg/dL (ref 0–149)
VLDL Cholesterol Cal: 13 mg/dL (ref 5–40)

## 2020-09-08 LAB — VITAMIN D 25 HYDROXY (VIT D DEFICIENCY, FRACTURES): Vit D, 25-Hydroxy: 48.3 ng/mL (ref 30.0–100.0)

## 2020-09-08 LAB — HEMOGLOBIN A1C
Est. average glucose Bld gHb Est-mCnc: 105 mg/dL
Hgb A1c MFr Bld: 5.3 % (ref 4.8–5.6)

## 2020-09-08 LAB — INSULIN, RANDOM: INSULIN: 8.9 u[IU]/mL (ref 2.6–24.9)

## 2020-09-12 NOTE — Progress Notes (Signed)
Chief Complaint:   OBESITY Carolyn Shaw is here to discuss her progress with her obesity treatment plan along with follow-up of her obesity related diagnoses. Carolyn Shaw is on keeping a food journal and adhering to recommended goals of 1400-1600 calories and 90 grams of protein and states she is following her eating plan approximately 75% of the time. Carolyn Shaw states she is not exercising.  Today's visit was #: 25 Starting weight: 219 lbs Starting date: 04/06/2019 Today's weight: 215 lbs Today's date: 09/07/2020 Total lbs lost to date: 4 lbs Total lbs lost since last in-office visit: 0  Interim History: Carolyn Shaw maintained her weight over the holidays. She has skipped some meals. Carolyn Shaw has not journaled much at all. She is on Saxenda 0.9 mg daily  Subjective:   1. Other hyperlipidemia  Carolyn Shaw's last LDL was slightly elevated at 102. HDL was 45.  Lab Results  Component Value Date   ALT 13 03/08/2020   AST 14 03/08/2020   ALKPHOS 54 03/08/2020   BILITOT 0.6 03/08/2020   Lab Results  Component Value Date   CHOL 174 09/07/2020   HDL 51 09/07/2020   LDLCALC 110 (H) 09/07/2020   TRIG 68 09/07/2020  The 10-year ASCVD risk score Mikey Bussing DC Jr., et al., 2013) is: 0.2%   Values used to calculate the score:     Age: 45 years     Sex: Female     Is Non-Hispanic African American: Yes     Diabetic: No     Tobacco smoker: No     Systolic Blood Pressure: 854 mmHg     Is BP treated: No     HDL Cholesterol: 51 mg/dL     Total Cholesterol: 174 mg/dL   2. Vitamin D deficiency Carolyn Shaw's Vitamin D level is low at 39. . She is currently taking prescription vitamin D 50,000 IU each week.   3. Insulin resistance [name] has a diagnosis of insulin resistance based on her elevated fasting insulin level >5.  Carolyn Shaw's last fasting insulin was 7.2. She notes hunger is satisfied. Carolyn Shaw is on Saxenda.  Lab Results  Component Value Date   INSULIN 8.9 09/07/2020   INSULIN 7.2 03/08/2020   INSULIN 8.3 04/06/2019    Lab Results  Component Value Date   HGBA1C 5.3 09/07/2020    4. At risk for side effect of medication Carolyn Shaw is at risk of side effects from increase in Saxenda dose.    Assessment/Plan:   1. Other hyperlipidemia  We will do FLP lab today. [name] will be more consistent with exercise.           2. Vitamin D deficiency  She agrees to continue to take prescription Vitamin D @50 ,000 IU every week and will follow-up for routine testing of Vitamin D, at least 2-3 times per year to avoid over-replacement.Carolyn Shaw will continue prescription Vitamin D.  - VITAMIN D 25 Hydroxy (Vit-D Deficiency, Fractures)  3. Insulin resistance Carolyn Shaw will continue Saxenda. Will check A1c, fasting insulin and glucose. Increase A1c.  - Hemoglobin A1c - Insulin, random  4. At risk for side effect of medication Carolyn Shaw was given approximately 15 minutes of drug side effect counseling today.  We discussed side effect possibility and risk versus benefits. Carolyn Shaw agreed to the medication and will contact this office if these side effects are intolerable.  Repetitive spaced learning was employed today to elicit superior memory formation and behavioral change.  5. Class 2 severe obesity with serious comorbidity and body mass index (BMI)  of 35.0 to 35.9 in adult, unspecified obesity type (HCC)   Carolyn Shaw is currently in the action stage of change. As such, her goal is to continue with weight loss efforts. She has agreed to keeping a food journal and adhering to recommended goals of 1400-1600 calories and 90 grams of protein daily.   Exercise goals: Carolyn Shaw will go to gym 2-3 times per week.  Behavioral modification strategies: increasing lean protein intake and keeping a strict food journal.  Increase Saxenda dose to 1.2 mg daily.  Carolyn Shaw has agreed to follow-up with our clinic in 2-3 weeks.    Carolyn Shaw was informed we would discuss her lab results at her next visit unless there is a critical issue that needs to be  addressed sooner. Carolyn Shaw agreed to keep her next visit at the agreed upon time to discuss these results.  Objective:   Blood pressure 101/68, pulse 75, temperature 98.1 F (36.7 C), height 5\' 5"  (1.651 m), weight 215 lb (97.5 kg), SpO2 96 %. Body mass index is 35.78 kg/m.  General: Cooperative, alert, well developed, in no acute distress. HEENT: Conjunctivae and lids unremarkable. Cardiovascular: Regular rhythm.  Lungs: Normal work of breathing. Neurologic: No focal deficits.   Lab Results  Component Value Date   CREATININE 0.79 03/08/2020   BUN 11 03/08/2020   NA 140 03/08/2020   K 4.1 03/08/2020   CL 103 03/08/2020   CO2 27 03/08/2020   Lab Results  Component Value Date   ALT 13 03/08/2020   AST 14 03/08/2020   ALKPHOS 54 03/08/2020   BILITOT 0.6 03/08/2020   Lab Results  Component Value Date   HGBA1C 5.3 09/07/2020   HGBA1C 5.2 03/08/2020   HGBA1C 5.1 04/06/2019   Lab Results  Component Value Date   INSULIN 8.9 09/07/2020   INSULIN 7.2 03/08/2020   INSULIN 8.3 04/06/2019   Lab Results  Component Value Date   TSH 3.090 10/08/2019   Lab Results  Component Value Date   CHOL 174 09/07/2020   HDL 51 09/07/2020   LDLCALC 110 (H) 09/07/2020   TRIG 68 09/07/2020   Lab Results  Component Value Date   WBC 8.8 03/08/2019   HGB 16.3 (H) 03/08/2019   HCT 45.4 03/08/2019   MCV 84.4 03/08/2019   PLT 283 03/08/2019   No results found for: IRON, TIBC, FERRITIN   Attestation Statements:   Reviewed by clinician on day of visit: allergies, medications, problem list, medical history, surgical history, family history, social history, and previous encounter notes.    Tula Nakayama, am acting as Location manager for Georgianne Fick, FNP.  I have reviewed the above documentation for accuracy and completeness, and I agree with the above. -  Georgianne Fick, FNP

## 2020-09-14 ENCOUNTER — Encounter (INDEPENDENT_AMBULATORY_CARE_PROVIDER_SITE_OTHER): Payer: Self-pay | Admitting: Family Medicine

## 2020-09-14 DIAGNOSIS — E7849 Other hyperlipidemia: Secondary | ICD-10-CM | POA: Insufficient documentation

## 2020-09-26 ENCOUNTER — Encounter (INDEPENDENT_AMBULATORY_CARE_PROVIDER_SITE_OTHER): Payer: Self-pay

## 2020-09-26 NOTE — Addendum Note (Signed)
Addended by: Georgianne Fick on: 09/26/2020 04:16 PM   Modules accepted: Level of Service

## 2020-09-27 ENCOUNTER — Ambulatory Visit (INDEPENDENT_AMBULATORY_CARE_PROVIDER_SITE_OTHER): Payer: BC Managed Care – PPO | Admitting: Family Medicine

## 2020-10-10 ENCOUNTER — Ambulatory Visit (INDEPENDENT_AMBULATORY_CARE_PROVIDER_SITE_OTHER): Payer: BC Managed Care – PPO | Admitting: Family Medicine

## 2020-11-24 ENCOUNTER — Other Ambulatory Visit: Payer: Self-pay

## 2020-11-24 ENCOUNTER — Other Ambulatory Visit (INDEPENDENT_AMBULATORY_CARE_PROVIDER_SITE_OTHER): Payer: Self-pay | Admitting: Family Medicine

## 2020-11-24 ENCOUNTER — Ambulatory Visit (INDEPENDENT_AMBULATORY_CARE_PROVIDER_SITE_OTHER): Payer: BC Managed Care – PPO | Admitting: Family Medicine

## 2020-11-24 ENCOUNTER — Encounter (INDEPENDENT_AMBULATORY_CARE_PROVIDER_SITE_OTHER): Payer: Self-pay | Admitting: Family Medicine

## 2020-11-24 VITALS — BP 98/66 | HR 75 | Temp 98.6°F | Ht 65.0 in | Wt 218.0 lb

## 2020-11-24 DIAGNOSIS — Z9189 Other specified personal risk factors, not elsewhere classified: Secondary | ICD-10-CM | POA: Diagnosis not present

## 2020-11-24 DIAGNOSIS — F3289 Other specified depressive episodes: Secondary | ICD-10-CM

## 2020-11-24 DIAGNOSIS — E559 Vitamin D deficiency, unspecified: Secondary | ICD-10-CM

## 2020-11-24 DIAGNOSIS — Z6836 Body mass index (BMI) 36.0-36.9, adult: Secondary | ICD-10-CM

## 2020-11-24 DIAGNOSIS — E7849 Other hyperlipidemia: Secondary | ICD-10-CM

## 2020-11-24 MED ORDER — BUPROPION HCL ER (SR) 150 MG PO TB12: 150.0000 mg | ORAL_TABLET | Freq: Every day | ORAL | 0 refills | Status: DC

## 2020-11-24 MED ORDER — VITAMIN D (ERGOCALCIFEROL) 1.25 MG (50000 UNIT) PO CAPS: 50000.0000 [IU] | ORAL_CAPSULE | ORAL | 0 refills | Status: DC

## 2020-11-24 NOTE — Progress Notes (Signed)
The 10-year ASCVD risk score Mikey Bussing DC Brooke Bonito., et al., 2013) is: 0.2%   Values used to calculate the score:     Age: 45 years     Sex: Female     Is Non-Hispanic African American: Yes     Diabetic: No     Tobacco smoker: No     Systolic Blood Pressure: 98 mmHg     Is BP treated: No     HDL Cholesterol: 51 mg/dL     Total Cholesterol: 174 mg/dL

## 2020-11-29 NOTE — Progress Notes (Signed)
Chief Complaint:   OBESITY Carolyn Shaw is here to discuss her progress with her obesity treatment plan along with follow-up of her obesity related diagnoses. Carolyn Shaw is on keeping a food journal and adhering to recommended goals of 1400-1600 calories and 90 grams of protein daily and states she is following her eating plan approximately 0% of the time. Carolyn Shaw states she has increased walking.   Today's visit was #: 48 Starting weight: 219 lbs Starting date: 04/06/2019 Today's weight: 218 lbs Today's date: 11/24/2020 Total lbs lost to date: 1 Total lbs lost since last in-office visit: +3  Interim History: Carolyn Shaw has not had an OV since 09/07/20. She is on 0.9 mg of Saxenda daily. She is having quite a bit of stress eating due to an upcoming job change. She is skipping meals, protein intake is down and carbohydrates are increased. She is going to [place] to visit her boyfriend in 3 weeks.  Subjective:   1. Vitamin D deficiency Carolyn Shaw's Vit D level is nearly at goal at 48.3. She is on weekly prescription Vit D. She has been off of Vit D for 3 weeks because she ran out. I discussed labs with the patient today.  2. Other hyperlipidemia Carolyn Shaw's ASCVD 10 year risk score is only 0.2%. Her LDL increased to 110 from 102. Her HDL and triglycerides are within normal limits. I discussed labs with the patient today.  Lab Results  Component Value Date   CHOL 174 09/07/2020   HDL 51 09/07/2020   LDLCALC 110 (H) 09/07/2020   TRIG 68 09/07/2020   Lab Results  Component Value Date   ALT 13 03/08/2020   AST 14 03/08/2020   ALKPHOS 54 03/08/2020   BILITOT 0.6 03/08/2020   The 10-year ASCVD risk score Carolyn Bussing DC Jr., Carolyn al., 2013) is: 0.2%   Values used to calculate the score:     Age: 45 years     Sex: Female     Is Non-Hispanic African American: Yes     Diabetic: No     Tobacco smoker: No     Systolic Blood Pressure: 98 mmHg     Is BP treated: No     HDL Cholesterol: 51 mg/dL     Total  Cholesterol: 174 mg/dL  3. Other depression, emotional eating Carolyn Shaw notes excessive stress. She ran out of bupropion and has been off of it. She notes stress eating is not well controlled.  4. At risk for heart disease Carolyn Shaw is at a higher than average risk for cardiovascular disease due to obesity and hyperlipidemia.   Assessment/Plan:   1. Vitamin D deficiency . We will refill prescription Vitamin D for 1 month. Carolyn Shaw will follow-up for routine testing of Vitamin D, at least 2-3 times per year to avoid over-replacement.  - Vitamin D, Ergocalciferol, (DRISDOL) 1.25 MG (50000 UNIT) CAPS capsule; Take 1 capsule (50,000 Units total) by mouth every 7 (seven) days.  Dispense: 4 capsule; Refill: 0  2. Other hyperlipidemia Cardiovascular risk and specific lipid/LDL goals reviewed. We discussed several lifestyle modifications today. Carolyn Shaw will continue her meal plan, and will continue to work on diet, exercise and weight loss efforts.  Counseling Intensive lifestyle modifications are the first line treatment for this issue. . Dietary changes: Increase soluble fiber. Decrease simple carbohydrates. . Exercise changes: Moderate to vigorous-intensity aerobic activity 150 minutes per week if tolerated. . Lipid-lowering medications: see documented in medical record.  3. Other depression, emotional eating  We will refill bupropion  for 1 month. - buPROPion (WELLBUTRIN SR) 150 MG 12 hr tablet; Take 1 tablet (150 mg total) by mouth daily.  Dispense: 30 tablet; Refill: 0  4. At risk for heart disease Carolyn Shaw was given approximately 15 minutes of coronary artery disease prevention counseling today. She is 45 y.o. female and has risk factors for heart disease including obesity. We discussed intensive lifestyle modifications today with an emphasis on specific weight loss instructions and strategies.   Repetitive spaced learning was employed today to elicit superior memory formation and behavioral  change.  5. Class 2 severe obesity with serious comorbidity and body mass index (BMI) of 36.0 to 36.9 in adult, unspecified obesity type (HCC) Carolyn Shaw is currently in the action stage of change. As such, her goal is to continue with weight loss efforts. She has agreed to keeping a food journal and adhering to recommended goals of 1400-1600 calories and 90 grams of protein.   We discussed various medication options to help Carolyn Shaw with her weight loss efforts and we both agreed to increase Saxenda to 1.2 mg daily.  Exercise goals: As is.  Behavioral modification strategies: increasing lean protein intake, decreasing simple carbohydrates and keeping a strict food journal.  Carolyn Shaw has agreed to follow-up with our clinic in 4 weeks.   Objective:   Blood pressure 98/66, pulse 75, temperature 98.6 F (37 C), temperature source Oral, height 5\' 5"  (1.651 m), weight 218 lb (98.9 kg), SpO2 97 %. Body mass index is 36.28 kg/m.  General: Cooperative, alert, well developed, in no acute distress. HEENT: Conjunctivae and lids unremarkable. Cardiovascular: Regular rhythm.  Lungs: Normal work of breathing. Neurologic: No focal deficits.   Lab Results  Component Value Date   CREATININE 0.79 03/08/2020   BUN 11 03/08/2020   NA 140 03/08/2020   K 4.1 03/08/2020   CL 103 03/08/2020   CO2 27 03/08/2020   Lab Results  Component Value Date   ALT 13 03/08/2020   AST 14 03/08/2020   ALKPHOS 54 03/08/2020   BILITOT 0.6 03/08/2020   Lab Results  Component Value Date   HGBA1C 5.3 09/07/2020   HGBA1C 5.2 03/08/2020   HGBA1C 5.1 04/06/2019   Lab Results  Component Value Date   INSULIN 8.9 09/07/2020   INSULIN 7.2 03/08/2020   INSULIN 8.3 04/06/2019   Lab Results  Component Value Date   TSH 3.090 10/08/2019   Lab Results  Component Value Date   CHOL 174 09/07/2020   HDL 51 09/07/2020   LDLCALC 110 (H) 09/07/2020   TRIG 68 09/07/2020   Lab Results  Component Value Date   WBC 8.8  03/08/2019   HGB 16.3 (H) 03/08/2019   HCT 45.4 03/08/2019   MCV 84.4 03/08/2019   PLT 283 03/08/2019   No results found for: IRON, TIBC, FERRITIN  Attestation Statements:   Reviewed by clinician on day of visit: allergies, medications, problem list, medical history, surgical history, family history, social history, and previous encounter notes.   Wilhemena Durie, am acting as Location manager for [employer], FNP-C.  I have reviewed the above documentation for accuracy and completeness, and I agree with the above. -  Georgianne Fick, FNP

## 2020-12-01 ENCOUNTER — Encounter (INDEPENDENT_AMBULATORY_CARE_PROVIDER_SITE_OTHER): Payer: Self-pay | Admitting: Family Medicine

## 2020-12-21 ENCOUNTER — Ambulatory Visit (INDEPENDENT_AMBULATORY_CARE_PROVIDER_SITE_OTHER): Payer: BC Managed Care – PPO | Admitting: Family Medicine

## 2020-12-21 ENCOUNTER — Encounter (INDEPENDENT_AMBULATORY_CARE_PROVIDER_SITE_OTHER): Payer: Self-pay | Admitting: Family Medicine

## 2020-12-21 ENCOUNTER — Other Ambulatory Visit: Payer: Self-pay

## 2020-12-21 VITALS — BP 122/76 | HR 74 | Temp 98.0°F | Ht 65.0 in | Wt 219.0 lb

## 2020-12-21 DIAGNOSIS — E559 Vitamin D deficiency, unspecified: Secondary | ICD-10-CM

## 2020-12-21 DIAGNOSIS — E8881 Metabolic syndrome: Secondary | ICD-10-CM

## 2020-12-21 DIAGNOSIS — Z9189 Other specified personal risk factors, not elsewhere classified: Secondary | ICD-10-CM

## 2020-12-21 DIAGNOSIS — E88819 Insulin resistance, unspecified: Secondary | ICD-10-CM

## 2020-12-21 DIAGNOSIS — Z6836 Body mass index (BMI) 36.0-36.9, adult: Secondary | ICD-10-CM | POA: Diagnosis not present

## 2020-12-21 MED ORDER — VITAMIN D (ERGOCALCIFEROL) 1.25 MG (50000 UNIT) PO CAPS: 50000.0000 [IU] | ORAL_CAPSULE | ORAL | 0 refills | Status: DC

## 2020-12-22 NOTE — Progress Notes (Addendum)
Chief Complaint:   OBESITY Carolyn Shaw is here to discuss her progress with her obesity treatment plan along with follow-up of her obesity related diagnoses. Carolyn Shaw is on keeping a food journal and adhering to recommended goals of 1400-1600 calories and 90 g protein and states she is following her eating plan approximately 60% of the time. Carolyn Shaw states she is walking 90 minutes 3 times per week.  Today's visit was #: 19 Starting weight: 219 lbs Starting date: 04/06/2019 Today's weight: 219 lbs Today's date: 12/21/2020 Total lbs lost to date: 0 Total lbs lost since last in-office visit: 0  Interim History: Carolyn Shaw was on spring break last week and she reports she was off plan. She did do a lot of walking. She is starting a new job in August at [employer]. She is on Saxenda. She denies side effects from 1.2 mg Saxenda.  Subjective:   1. Insulin resistance Carolyn Shaw is on Saxenda. Hunger is well controlled. Her last A1c was 5.3.  Lab Results  Component Value Date   INSULIN 8.9 09/07/2020   INSULIN 7.2 03/08/2020   INSULIN 8.3 04/06/2019   Lab Results  Component Value Date   HGBA1C 5.3 09/07/2020   2. Vitamin D deficiency Carolyn Shaw Vit D is nearly at goal (48.3). She is on weekly prescription Vit D.  3. At risk for side effect of medication Carolyn Shaw is at risk for side effects of medication due to increased dose of Saxenda.  Assessment/Plan:   1. Insulin resistance  Continue Saxenda but increase dose to 1.8 mg QD.  2. Vitamin D deficiency  She agrees to continue to take prescription Vitamin D @50 ,000 IU every week and will follow-up for routine testing of Vitamin D, at least 2-3 times per year to avoid over-replacement.  - Vitamin D, Ergocalciferol, (DRISDOL) 1.25 MG (50000 UNIT) CAPS capsule; Take 1 capsule (50,000 Units total) by mouth every 7 (seven) days.  Dispense: 4 capsule; Refill: 0  3. At risk for side effect of medication Carolyn Shaw was given approximately 15 minutes of  drug side effect counseling today.  We discussed side effect possibility and risk versus benefits. Carolyn Shaw agreed to the medication and will contact this office if these side effects are intolerable.  Repetitive spaced learning was employed today to elicit superior memory formation and behavioral change.  4. Obesity: BMI 36.44  Carolyn Shaw is currently in the action stage of change. As such, her goal is to continue with weight loss efforts. She has agreed to keeping a food journal and adhering to recommended goals of 1400-1600 calories and 90 grams protein.   Increase dose of Saxenda to 1.8 mg daily.  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  Behavioral modification strategies: increasing lean protein intake, decreasing simple carbohydrates and keeping a strict food journal.  Carolyn Shaw has agreed to follow-up with our clinic in 3 weeks.   Objective:   Blood pressure 122/76, pulse 74, temperature 98 F (36.7 C), height 5\' 5"  (1.651 m), weight 219 lb (99.3 kg), SpO2 97 %. Body mass index is 36.44 kg/m.  General: Cooperative, alert, well developed, in no acute distress. HEENT: Conjunctivae and lids unremarkable. Cardiovascular: Regular rhythm.  Lungs: Normal work of breathing. Neurologic: No focal deficits.   Lab Results  Component Value Date   CREATININE 0.79 03/08/2020   BUN 11 03/08/2020   NA 140 03/08/2020   K 4.1 03/08/2020   CL 103 03/08/2020   CO2 27 03/08/2020   Lab Results  Component Value Date   ALT 13 03/08/2020   AST 14 03/08/2020   ALKPHOS 54 03/08/2020   BILITOT 0.6 03/08/2020   Lab Results  Component Value Date   HGBA1C 5.3 09/07/2020   HGBA1C 5.2 03/08/2020   HGBA1C 5.1 04/06/2019   Lab Results  Component Value Date   INSULIN 8.9 09/07/2020   INSULIN 7.2 03/08/2020   INSULIN 8.3 04/06/2019   Lab Results  Component Value Date   TSH 3.090 10/08/2019   Lab Results  Component Value Date   CHOL 174 09/07/2020   HDL 51  09/07/2020   LDLCALC 110 (H) 09/07/2020   TRIG 68 09/07/2020   Lab Results  Component Value Date   WBC 8.8 03/08/2019   HGB 16.3 (H) 03/08/2019   HCT 45.4 03/08/2019   MCV 84.4 03/08/2019   PLT 283 03/08/2019     Attestation Statements:   Reviewed by clinician on day of visit: allergies, medications, problem list, medical history, surgical history, family history, social history, and previous encounter notes.  Coral Ceo, am acting as Location manager for [employer], [place].  I have reviewed the above documentation for accuracy and completeness, and I agree with the above. -  Georgianne Fick, FNP

## 2020-12-27 ENCOUNTER — Encounter (INDEPENDENT_AMBULATORY_CARE_PROVIDER_SITE_OTHER): Payer: Self-pay | Admitting: Family Medicine

## 2021-01-11 ENCOUNTER — Other Ambulatory Visit (INDEPENDENT_AMBULATORY_CARE_PROVIDER_SITE_OTHER): Payer: Self-pay | Admitting: Family Medicine

## 2021-01-11 ENCOUNTER — Ambulatory Visit (INDEPENDENT_AMBULATORY_CARE_PROVIDER_SITE_OTHER): Payer: BC Managed Care – PPO | Admitting: Family Medicine

## 2021-01-11 ENCOUNTER — Other Ambulatory Visit: Payer: Self-pay

## 2021-01-11 ENCOUNTER — Encounter (INDEPENDENT_AMBULATORY_CARE_PROVIDER_SITE_OTHER): Payer: Self-pay | Admitting: Family Medicine

## 2021-01-11 VITALS — BP 109/71 | HR 85 | Temp 98.4°F | Ht 65.0 in | Wt 218.0 lb

## 2021-01-11 DIAGNOSIS — E559 Vitamin D deficiency, unspecified: Secondary | ICD-10-CM

## 2021-01-11 DIAGNOSIS — Z6836 Body mass index (BMI) 36.0-36.9, adult: Secondary | ICD-10-CM

## 2021-01-11 DIAGNOSIS — F3289 Other specified depressive episodes: Secondary | ICD-10-CM | POA: Diagnosis not present

## 2021-01-11 MED ORDER — VITAMIN D (ERGOCALCIFEROL) 1.25 MG (50000 UNIT) PO CAPS: 50000.0000 [IU] | ORAL_CAPSULE | ORAL | 0 refills | Status: DC

## 2021-01-11 MED ORDER — VITAMIN D (ERGOCALCIFEROL) 1.25 MG (50000 UNIT) PO CAPS: 50000.0000 [IU] | ORAL_CAPSULE | ORAL | 1 refills | Status: AC

## 2021-01-11 MED ORDER — BUPROPION HCL ER (SR) 150 MG PO TB12: 150.0000 mg | ORAL_TABLET | Freq: Every day | ORAL | 1 refills | Status: DC

## 2021-01-11 MED ORDER — BUPROPION HCL ER (SR) 150 MG PO TB12: 150.0000 mg | ORAL_TABLET | Freq: Every day | ORAL | 0 refills | Status: DC

## 2021-01-11 MED ORDER — INSULIN PEN NEEDLE 32G X 4 MM MISC: 0 refills | Status: DC

## 2021-01-11 MED ORDER — SAXENDA 18 MG/3ML ~~LOC~~ SOPN: 3.0000 mg | PEN_INJECTOR | Freq: Every day | SUBCUTANEOUS | 0 refills | Status: DC

## 2021-01-11 NOTE — Telephone Encounter (Signed)
Requesting 3mo supply

## 2021-01-12 ENCOUNTER — Encounter (INDEPENDENT_AMBULATORY_CARE_PROVIDER_SITE_OTHER): Payer: Self-pay | Admitting: Family Medicine

## 2021-01-12 NOTE — Progress Notes (Signed)
Chief Complaint:   OBESITY Carolyn Shaw is here to discuss her progress with her obesity treatment plan along with follow-up of her obesity related diagnoses. [name] is on keeping a food journal and adhering to recommended goals of 1400-1600 calories and 90 g protein and states she is following her eating plan approximately 50% of the time. [name] states she is not currently exercising.  Today's visit was #: 28 Starting weight: 219 lbs Starting date: 04/06/2019 Today's weight: 218 lbs Today's date: 01/11/2021 Total lbs lost to date: 1 Total lbs lost since last in-office visit: 1  Interim History: [name] is not journaling consistently. She increased Saxenda to 1.8 mg and had reflux. However, her appetite was suppressed better. She admits to not being as committed to the plan as she wants to be. Her work schedule is extreme and she never has any time to devote to self care. She did attend an exercise class on the past few weeks nd really enjoyed it. She is not meal prepping due to lack of time.  Subjective:   1. Other depression, emotional eating Lorelei's mood is stable. She is very busy with 2 jobs. She feels fatigued and stressed. Bupropion helps with focus.  2. Vitamin D deficiency Vit D is low at 48.3 (nearly at goal). She is on prescription Vit D weekly.  Assessment/Plan:   1. Other depression, emotional eating Refill: - buPROPion (WELLBUTRIN SR) 150 MG 12 hr tablet; Take 1 tablet (150 mg total) by mouth daily.  Dispense: 30 tablet; Refill: 1  2. Vitamin D deficiency  She agrees to continue to take prescription Vitamin D @50 ,000 IU every week and will follow-up for routine testing of Vitamin D, at least 2-3 times per year to avoid over-replacement. - Vitamin D, Ergocalciferol, (DRISDOL) 1.25 MG (50000 UNIT) CAPS capsule; Take 1 capsule (50,000 Units total) by mouth every 7 (seven) days.  Dispense: 4 capsule; Refill: 1  3. Obesity: BMI 36.44 [name] is currently in the action stage of  change. As such, her goal is to continue with weight loss efforts. She has agreed to keeping a food journal and adhering to recommended goals of 1400-1600 calories and 90 g protein.  We both agreed to decrease dose of Saxenda to 1.5 mg daily, as prescribed below. - Liraglutide -Weight Management (SAXENDA) 18 MG/3ML SOPN; Inject 3 mg into the skin daily.  Dispense: 15 mL; Refill: 0 - Insulin Pen Needle 32G X 4 MM MISC; Use one needles daily to inject Saxenda.  Dispense: 100 each; Refill: 0  Exercise goals: All adults should avoid inactivity. Some physical activity is better than none, and adults who participate in any amount of physical activity gain some health benefits.  Behavioral modification strategies: decreasing simple carbohydrates. I advised that she must take time for herself to be successful with weight loss and improving her health. She has to prioritize this.  Niza has agreed to follow-up with our clinic in 6 weeks.We decided she will take a short break to let her work schedule calm down. She works in the [employer].   Objective:   Blood pressure 109/71, pulse 85, temperature 98.4 F (36.9 C), height 5\' 5"  (1.651 m), weight 218 lb (98.9 kg), SpO2 97 %. Body mass index is 36.28 kg/m.  General: Cooperative, alert, well developed, in no acute distress. HEENT: Conjunctivae and lids unremarkable. Cardiovascular: Regular rhythm.  Lungs: Normal work of breathing. Neurologic: No focal deficits.   Lab Results  Component Value Date  CREATININE 0.79 03/08/2020   BUN 11 03/08/2020   NA 140 03/08/2020   K 4.1 03/08/2020   CL 103 03/08/2020   CO2 27 03/08/2020   Lab Results  Component Value Date   ALT 13 03/08/2020   AST 14 03/08/2020   ALKPHOS 54 03/08/2020   BILITOT 0.6 03/08/2020   Lab Results  Component Value Date   HGBA1C 5.3 09/07/2020   HGBA1C 5.2 03/08/2020   HGBA1C 5.1 04/06/2019   Lab Results  Component Value Date   INSULIN 8.9 09/07/2020    INSULIN 7.2 03/08/2020   INSULIN 8.3 04/06/2019   Lab Results  Component Value Date   TSH 3.090 10/08/2019   Lab Results  Component Value Date   CHOL 174 09/07/2020   HDL 51 09/07/2020   LDLCALC 110 (H) 09/07/2020   TRIG 68 09/07/2020   Lab Results  Component Value Date   WBC 8.8 03/08/2019   HGB 16.3 (H) 03/08/2019   HCT 45.4 03/08/2019   MCV 84.4 03/08/2019   PLT 283 03/08/2019    Attestation Statements:   Reviewed by clinician on day of visit: allergies, medications, problem list, medical history, surgical history, family history, social history, and previous encounter notes.  Coral Ceo, CMA, am acting as Location manager for [employer], [place].  I have reviewed the above documentation for accuracy and completeness, and I agree with the above. -  Georgianne Fick, FNP

## 2021-01-24 ENCOUNTER — Other Ambulatory Visit: Payer: Self-pay

## 2021-01-24 ENCOUNTER — Ambulatory Visit: Payer: Self-pay | Attending: Internal Medicine

## 2021-01-24 DIAGNOSIS — Z23 Encounter for immunization: Secondary | ICD-10-CM

## 2021-01-24 NOTE — Progress Notes (Signed)
   Covid-19 Vaccination Clinic  Name:  [name]    MRN: [id number] DOB: Jun 28, 1976  01/24/2021  [name] was observed post Covid-19 immunization for 15 minutes without incident. She was provided with Vaccine Information Sheet and instruction to access the V-Safe system.   [name] was instructed to call 911 with any severe reactions post vaccine: Marland Kitchen Difficulty breathing  . Swelling of face and throat  . A fast heartbeat  . A bad rash all over body  . Dizziness and weakness   Immunizations Administered    Name Date Dose VIS Date Route   PFIZER Comrnaty(Gray TOP) Covid-19 Vaccine 01/24/2021  3:35 PM 0.3 mL 08/04/2020 Intramuscular   Manufacturer: [place]   Lot: [id number]   [place]: [phone]

## 2021-01-25 ENCOUNTER — Telehealth (INDEPENDENT_AMBULATORY_CARE_PROVIDER_SITE_OTHER): Payer: Self-pay | Admitting: Emergency Medicine

## 2021-01-25 NOTE — Telephone Encounter (Signed)
Prior Auth: Saxenda: Approved for 6 months  Ref #[id number]

## 2021-02-22 ENCOUNTER — Ambulatory Visit (INDEPENDENT_AMBULATORY_CARE_PROVIDER_SITE_OTHER): Payer: BC Managed Care – PPO | Admitting: Family Medicine

## 2021-03-01 IMAGING — MG DIGITAL SCREENING BILAT W/ TOMO W/ CAD
8 series · 8 of 24 positions shown · non-contrast
Comparison: Previous exam(s).

CLINICAL DATA: Screening.

EXAM:
DIGITAL SCREENING BILATERAL MAMMOGRAM WITH TOMO AND CAD

[R CC synth-2D]
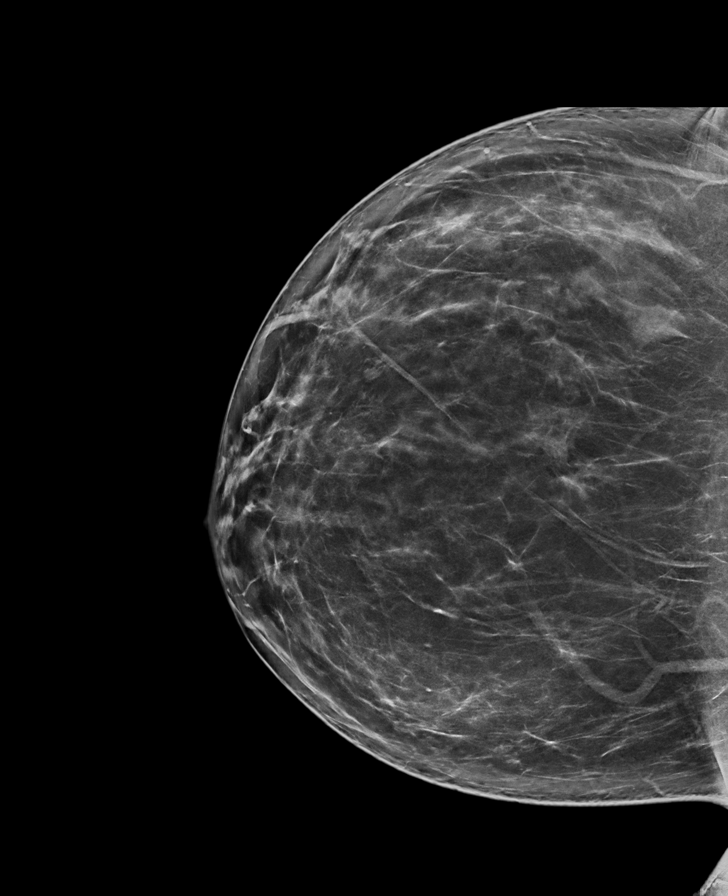

[R MLO synth-2D]
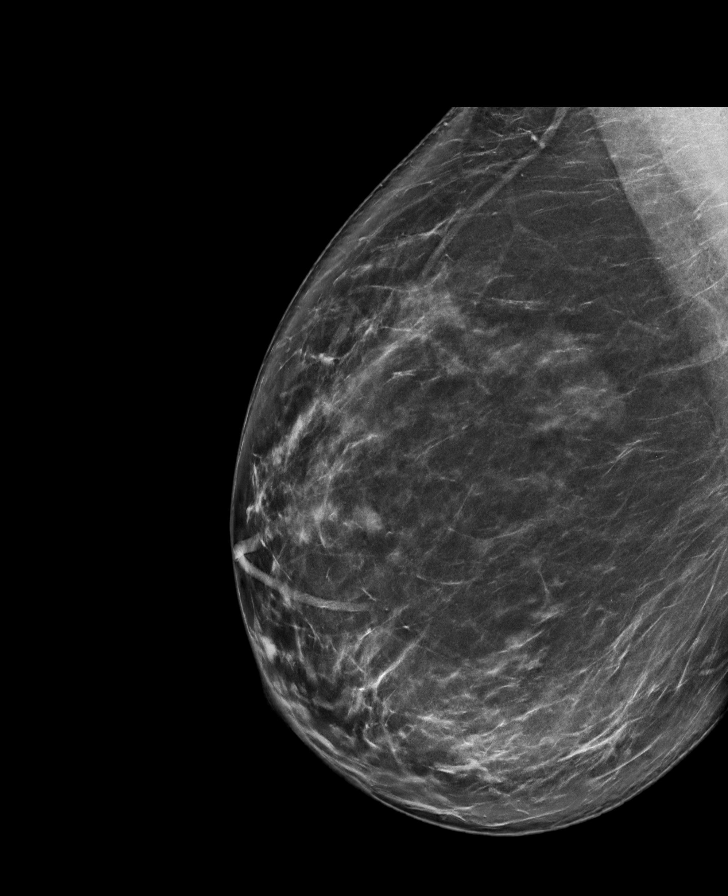

[L MLO synth-2D]
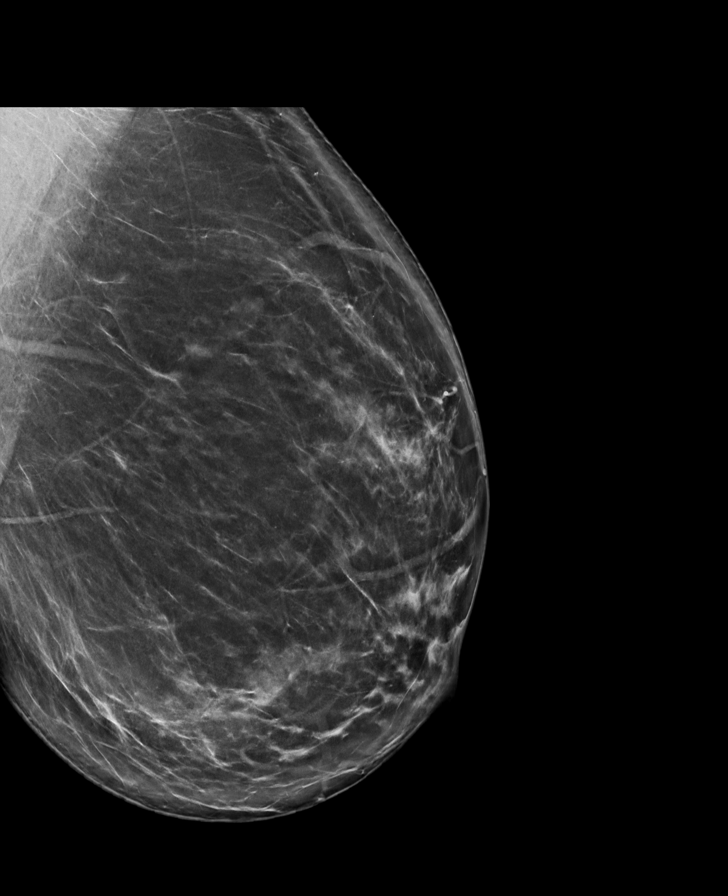

[L CC synth-2D]
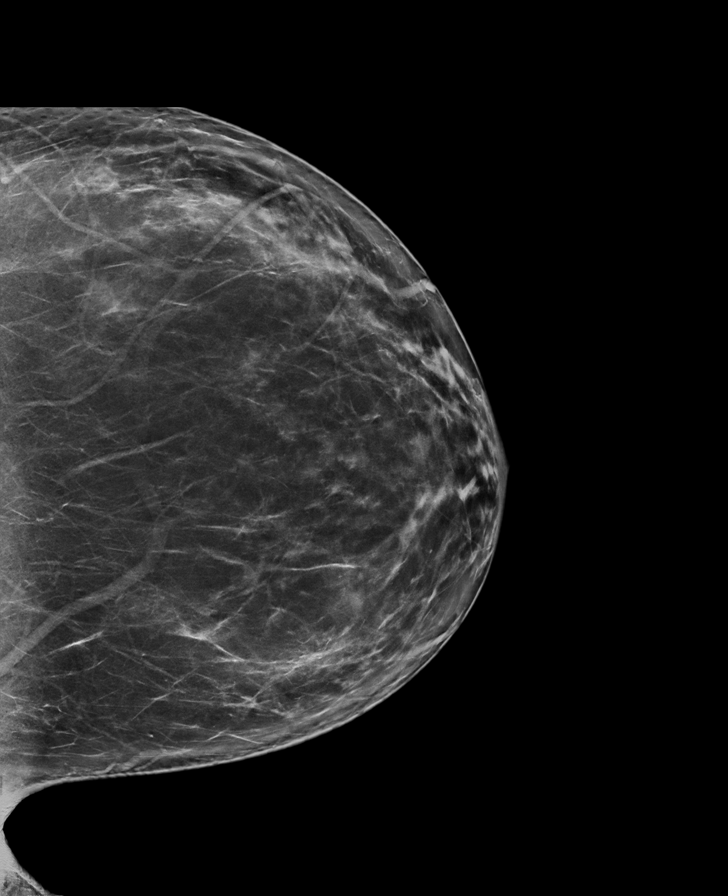

[L CC tomo · tomo slice 47/93.0]
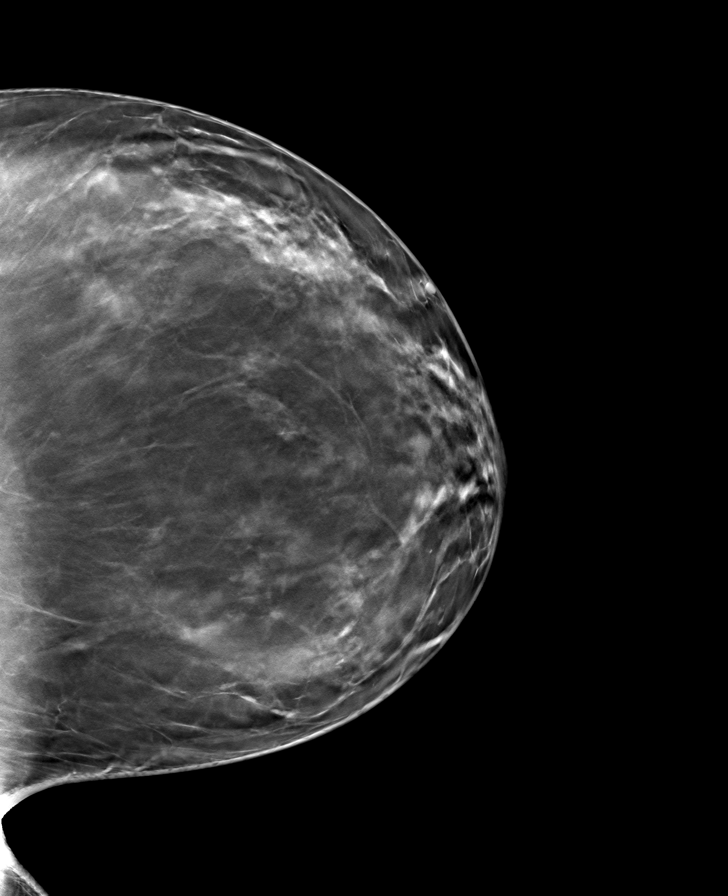

[R MLO tomo · tomo slice 49/98.0]
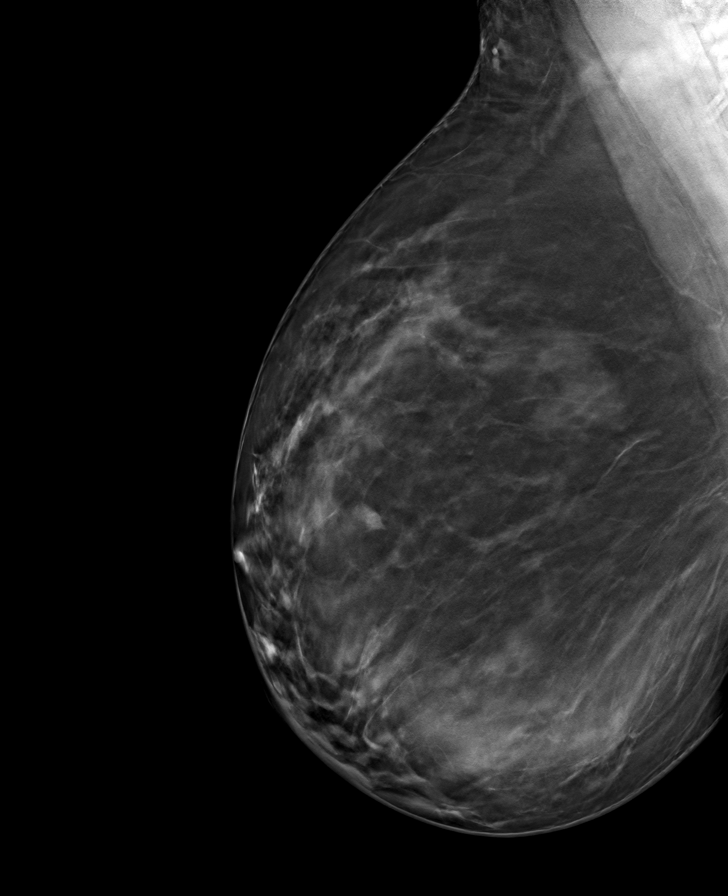

[R CC tomo · tomo slice 47/92.0]
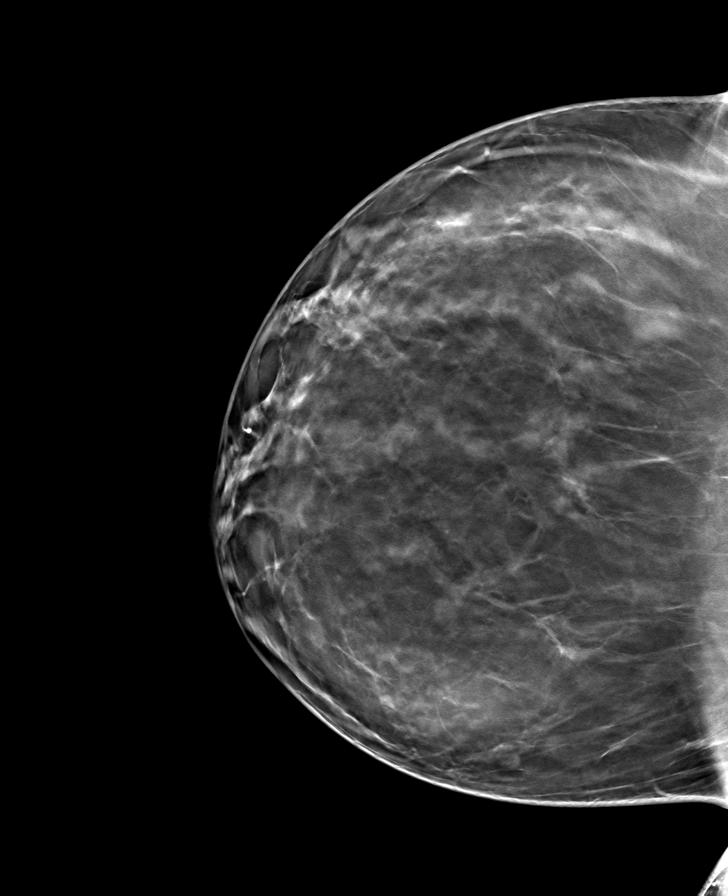

[L MLO tomo · tomo slice 49/97.0]
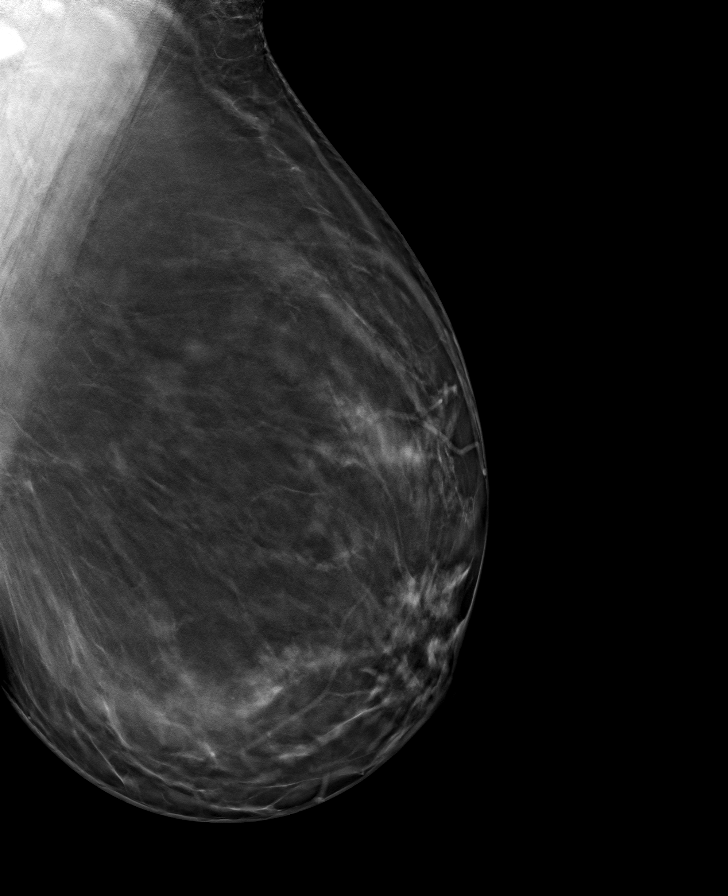

[8 of 24 positions shown; findings below may reference images not displayed]

ACR Breast Density Category b: There are scattered areas of
fibroglandular density.
FINDINGS: There are no findings suspicious for malignancy. Images were
processed with CAD.
IMPRESSION: No mammographic evidence of malignancy. A result letter of this
screening mammogram will be mailed directly to the patient.

RECOMMENDATION:
Screening mammogram in one year. (Code:[id number])

BI-RADS CATEGORY  1: Negative.

## 2021-03-23 ENCOUNTER — Other Ambulatory Visit: Payer: Self-pay | Admitting: Obstetrics and Gynecology

## 2021-03-23 DIAGNOSIS — Z1231 Encounter for screening mammogram for malignant neoplasm of breast: Secondary | ICD-10-CM

## 2021-05-26 ENCOUNTER — Other Ambulatory Visit: Payer: Self-pay

## 2021-05-26 ENCOUNTER — Ambulatory Visit
Admission: RE | Admit: 2021-05-26 | Discharge: 2021-05-26 | Disposition: A | Discharge status: Home / Self Care | Payer: BLUE CROSS/BLUE SHIELD | Source: Ambulatory Visit | Attending: Obstetrics and Gynecology | Admitting: Obstetrics and Gynecology

## 2021-05-26 DIAGNOSIS — Z1231 Encounter for screening mammogram for malignant neoplasm of breast: Secondary | ICD-10-CM

## 2021-07-26 NOTE — Progress Notes (Signed)
NEUROLOGY FOLLOW UP OFFICE NOTE  Carolyn Shaw [id number]  Assessment/Plan:   1  Migraine with aura, without status migrainosus, not intractable, menstrual-related. 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 We will try tapering off of topiramate.  She will take 25mg  at bedtime.  If doing well in 4 weeks, she will discontinue. If headaches increase in frequency, will restart topiramate.  For abortive therapy, I will have her try Nurtec.  Given the presence of aneurysms, I would like her to discontinue Maxalt.  Limit use of pain relievers to no more than 2 days out of week to prevent risk of rebound or medication-overuse headache.  Keep headache diary  Exercise, hydration, caffeine cessation, sleep hygiene, monitor for and avoid triggers  Follow up 1 year  Subjective:  Carolyn Shaw is a 45 year old left-handed woman with migraines, uterine fibroids, allergic rhinitis and GERD who follows up for migraines.   UPDATE: Intensity:  5/10 Duration:  Up to 2 hours with Maxalt or Excedrin Frequency:  2 days a month (usually right before cycle, during and maybe a dull one afterwards) Frequency of abortive medication:  2 days a month Since she has been doing well for a while, she was inquiring about discontinuing topiramate.  She saw the optometrist last week and was found to have micro-aneurysms in the right eye.  No family history of aneurysms.  Rescue protocol:  Excedrin first line, Maxalt 2nd line. Current NSAIDS:  None Current analgesics:  Excedrin Current triptans: Maxalt 10 mg Current ergotamine: None Current anti-emetic: None Current muscle relaxants: None Current anti-anxiolytic: None Current sleep aide: None Current Antihypertensive medications: None Current Antidepressant medications: None Current Anticonvulsant medications: Topiramate 50 mg daily Current anti-CGRP: None Current  Vitamins/Herbal/Supplements: None Current Antihistamines/Decongestants: None Other therapy: None Hormone/birth control: No   Caffeine:  Very little Diet: Hydrates.  Does skip meals.   Depression: No; Anxiety:  Much improved.  Less stressful job Other pain: No Sleep hygiene: Okay   HISTORY: Onset:  Adolescence but formally diagnosed in 2002 Location:  Left periorbital region Quality:  Squeezing sensation of the eye followed by stabbing and pounding around the eye. Initial Intensity:  7-8/10 Associated symptoms:  photophobia, nausea, phonophobia.  No osmophobia, eye lacrimation or nasal congestion. Aura:  Visual field loss from the left moving to the right side leaving a left field cut briefly Initial Duration:  All day without medication (1-3 hours with medication) Initial Frequency:  Twice a week Activity:  Lay down but able to force self to be active if needed Triggers/aggravating factors:  Bright lights, loud noise, menstrual cycle, dehydration, prolonged screen time, skipped meals Relieving factors:  Laying down.   Past NSAIDs: Ibuprofen, naproxen Past analgesics: Excedrin, Tylenol Past preventative therapy:  none   Family history:  No history of headache  PAST MEDICAL HISTORY: Past Medical History:  Diagnosis Date   Anxiety    Back pain    Chest pain    Constipation    Depression    Fibroids    Food allergy    GERD (gastroesophageal reflux disease)    Lactose intolerance    Migraines    Seasonal allergies    Vitamin D deficiency     MEDICATIONS: Current Outpatient Medications on File Prior to Visit  Medication Sig Dispense Refill   aspirin-acetaminophen-caffeine (EXCEDRIN MIGRAINE) 250-250-65 MG per tablet Take by mouth every 6 (six) hours as needed  for headache or migraine.     buPROPion (WELLBUTRIN SR) 150 MG 12 hr tablet Take 1 tablet (150 mg total) by mouth daily. 30 tablet 1   Cholecalciferol 50 MCG (2000 UT) CAPS Take 1 capsule by mouth daily. OTC      famotidine (PEPCID) 20 MG tablet Take 1 tablet (20 mg total) by mouth 2 (two) times daily. 30 tablet 0   ibuprofen (ADVIL,MOTRIN) 800 MG tablet Take 800 mg by mouth every 8 (eight) hours as needed.     Insulin Pen Needle 32G X 4 MM MISC Use one needles daily to inject Saxenda. 100 each 0   Liraglutide -Weight Management (SAXENDA) 18 MG/3ML SOPN Inject 3 mg into the skin daily. 15 mL 0   Melatonin 5 MG CAPS Take 1 capsule (5 mg total) by mouth at bedtime as needed. 30 capsule 0   Multiple Vitamin (MULTIVITAMIN) tablet Take 1 tablet by mouth daily.     ondansetron (ZOFRAN ODT) 4 MG disintegrating tablet Take 1 tablet (4 mg total) by mouth every 8 (eight) hours as needed for nausea or vomiting. 20 tablet 0   rizatriptan (MAXALT) 10 MG tablet Take 1 tablet (10 mg total) by mouth as needed for migraine. May repeat in 2 hours if needed.  Maximum 2 tablets in 24 hours. 10 tablet 5   sucralfate (CARAFATE) 1 g tablet Take 1 tablet (1 g total) by mouth 4 (four) times daily -  with meals and at bedtime. 60 tablet 1   topiramate (TOPAMAX) 50 MG tablet Take 1 tablet (50 mg total) by mouth at bedtime. 90 tablet 3   Vitamin D, Ergocalciferol, (DRISDOL) 1.25 MG (50000 UNIT) CAPS capsule Take 1 capsule (50,000 Units total) by mouth every 7 (seven) days. 4 capsule 1   No current facility-administered medications on file prior to visit.    ALLERGIES: Allergies  Allergen Reactions   Chocolate Hives   Erythromycin Nausea And Vomiting    FAMILY HISTORY: Family History  Problem Relation Age of Onset   Diabetes Mother    High blood pressure Mother    Thyroid disease Mother    Sleep apnea Mother    Obesity Mother    Diabetes Father    High blood pressure Father    Stroke Father    Drug abuse Father    Obesity Father    Ataxia Neg Hx    Chorea Neg Hx    Dementia Neg Hx    Mental retardation Neg Hx    Migraines Neg Hx    Multiple sclerosis Neg Hx    Neurofibromatosis Neg Hx    Neuropathy Neg Hx     Parkinsonism Neg Hx    Seizures Neg Hx       Objective:  Blood pressure 118/76, pulse 78, height 5\' 5"  (1.651 m), weight 222 lb 3.2 oz (100.8 kg), SpO2 97 %. General: No acute distress.  Patient appears well-groomed.   Head:  Normocephalic/atraumatic Eyes:  Fundi examined but not visualized Neck: supple, no paraspinal tenderness, full range of motion Heart:  Regular rate and rhythm Lungs:  Clear to auscultation bilaterally Back: No paraspinal tenderness Neurological Exam: alert and oriented to person, place, and time.  Speech fluent and not dysarthric, language intact.  CN II-XII intact. Bulk and tone normal, muscle strength 5/5 throughout.  Sensation to light touch intact.  Deep tendon reflexes 2+ throughout, toes downgoing.  Finger to nose testing intact.  Gait normal, Romberg negative.   Metta Clines, DO  CC:  Donald Prose, MD

## 2021-07-27 ENCOUNTER — Encounter: Payer: Self-pay | Admitting: Neurology

## 2021-07-27 ENCOUNTER — Other Ambulatory Visit: Payer: Self-pay

## 2021-07-27 ENCOUNTER — Ambulatory Visit: Payer: BLUE CROSS/BLUE SHIELD | Admitting: Neurology

## 2021-07-27 VITALS — BP 118/76 | HR 78 | Ht 65.0 in | Wt 222.2 lb

## 2021-07-27 DIAGNOSIS — G43109 Migraine with aura, not intractable, without status migrainosus: Secondary | ICD-10-CM

## 2021-07-27 DIAGNOSIS — I729 Aneurysm of unspecified site: Secondary | ICD-10-CM

## 2021-07-27 NOTE — Patient Instructions (Signed)
Take 1/2 tablet of topiramate at bedtime for 4 weeks.  If still doing well, then stop it.  If headaches recur, let me know and we an restart it Stop maxalt.  Instead, at earliest onset of headache, take Nurtec (1 in 24 hours).  If effective or not, let me know Will check CTA of head Follow up one year or as needed.

## 2021-08-31 NOTE — Progress Notes (Signed)
Message from Spencer pending . Appt cancelled for right now.

## 2021-09-01 ENCOUNTER — Other Ambulatory Visit: Payer: BLUE CROSS/BLUE SHIELD

## 2021-09-04 ENCOUNTER — Other Ambulatory Visit: Payer: Self-pay

## 2021-09-04 DIAGNOSIS — G43109 Migraine with aura, not intractable, without status migrainosus: Secondary | ICD-10-CM

## 2021-09-04 DIAGNOSIS — I729 Aneurysm of unspecified site: Secondary | ICD-10-CM

## 2021-09-05 ENCOUNTER — Telehealth: Payer: Self-pay | Admitting: Neurology

## 2021-09-05 NOTE — Telephone Encounter (Signed)
Carolyn Shaw spoke with Carolyn Shaw yesterday reharding Carolyn Shaw. She needed the MRI order sent over, she said it was sent but it was not physically signed. The fax number is [phone]

## 2021-09-06 NOTE — Telephone Encounter (Signed)
Pending to sign then fax order

## 2021-09-22 ENCOUNTER — Telehealth: Payer: Self-pay | Admitting: Neurology

## 2021-09-22 NOTE — Telephone Encounter (Signed)
CTA of head does not reveal any blood vessel abnormalities such as aneurysm.  Incidentally, there is evidence of mild inflammation in one of her sinuses.  This may be related to her known history of allergic rhinitis, but if she is having any sinus issues, she should follow up with her PCP

## 2021-09-22 NOTE — Telephone Encounter (Signed)
LMOVM for pt, No answer. Please call the office back.

## 2021-09-25 ENCOUNTER — Telehealth: Payer: Self-pay | Admitting: Neurology

## 2021-09-25 NOTE — Telephone Encounter (Signed)
Pt called in returning Sheena's call about results 

## 2021-09-25 NOTE — Telephone Encounter (Signed)
Pt advised of her CTA results.

## 2021-09-25 NOTE — Telephone Encounter (Signed)
See note from 08/02/22

## 2021-11-26 ENCOUNTER — Other Ambulatory Visit (INDEPENDENT_AMBULATORY_CARE_PROVIDER_SITE_OTHER): Payer: Self-pay | Admitting: Family Medicine

## 2021-11-26 DIAGNOSIS — E559 Vitamin D deficiency, unspecified: Secondary | ICD-10-CM

## 2022-02-01 ENCOUNTER — Emergency Department (HOSPITAL_BASED_OUTPATIENT_CLINIC_OR_DEPARTMENT_OTHER)
Admission: EM | Admit: 2022-02-01 | Discharge: 2022-02-01 | Disposition: A | Discharge status: Home / Self Care | Payer: Managed Care, Other (non HMO) | Attending: Emergency Medicine | Admitting: Emergency Medicine

## 2022-02-01 ENCOUNTER — Emergency Department (HOSPITAL_BASED_OUTPATIENT_CLINIC_OR_DEPARTMENT_OTHER): Payer: Managed Care, Other (non HMO) | Admitting: Radiology

## 2022-02-01 ENCOUNTER — Encounter (HOSPITAL_BASED_OUTPATIENT_CLINIC_OR_DEPARTMENT_OTHER): Payer: Self-pay | Admitting: Emergency Medicine

## 2022-02-01 ENCOUNTER — Other Ambulatory Visit: Payer: Self-pay

## 2022-02-01 DIAGNOSIS — R0602 Shortness of breath: Secondary | ICD-10-CM | POA: Diagnosis not present

## 2022-02-01 DIAGNOSIS — R079 Chest pain, unspecified: Secondary | ICD-10-CM | POA: Insufficient documentation

## 2022-02-01 DIAGNOSIS — Z20822 Contact with and (suspected) exposure to covid-19: Secondary | ICD-10-CM | POA: Insufficient documentation

## 2022-02-01 LAB — CBC
HCT: 41.3 % (ref 36.0–46.0)
Hemoglobin: 14.9 g/dL (ref 12.0–15.0)
MCH: 30.2 pg (ref 26.0–34.0)
MCHC: 36.1 g/dL — ABNORMAL HIGH (ref 30.0–36.0)
MCV: 83.6 fL (ref 80.0–100.0)
Platelets: 272 10*3/uL (ref 150–400)
RBC: 4.94 MIL/uL (ref 3.87–5.11)
RDW: 13 % (ref 11.5–15.5)
WBC: 8 10*3/uL (ref 4.0–10.5)
nRBC: 0 % (ref 0.0–0.2)

## 2022-02-01 LAB — BASIC METABOLIC PANEL
Anion gap: 11 (ref 5–15)
BUN: 12 mg/dL (ref 6–20)
CO2: 27 mmol/L (ref 22–32)
Calcium: 9.7 mg/dL (ref 8.9–10.3)
Chloride: 103 mmol/L (ref 98–111)
Creatinine, Ser: 0.87 mg/dL (ref 0.44–1.00)
GFR, Estimated: >60 mL/min (ref >60)
Glucose, Bld: 96 mg/dL (ref 70–99)
Potassium: 4 mmol/L (ref 3.5–5.1)
Sodium: 141 mmol/L (ref 135–145)

## 2022-02-01 LAB — TROPONIN I (HIGH SENSITIVITY): Troponin I (High Sensitivity): <2 ng/L (ref <18)

## 2022-02-01 LAB — SARS CORONAVIRUS 2 BY RT PCR: SARS Coronavirus 2 by RT PCR: NEGATIVE

## 2022-02-01 LAB — D-DIMER, QUANTITATIVE: D-Dimer, Quant: 0.35 ug/mL-FEU (ref 0.00–0.50)

## 2022-02-01 MED ORDER — NAPROXEN 500 MG PO TABS: 500.0000 mg | ORAL_TABLET | Freq: Two times a day (BID) | ORAL | 0 refills | Status: DC

## 2022-02-01 MED ORDER — NAPROXEN 250 MG PO TABS
500.0000 mg | ORAL_TABLET | Freq: Once | ORAL | Status: AC
  Administered 2022-02-01: 500 mg via ORAL
  Filled 2022-02-01: qty 2

## 2022-02-01 MED ORDER — ALBUTEROL SULFATE HFA 108 (90 BASE) MCG/ACT IN AERS
2.0000 | INHALATION_SPRAY | RESPIRATORY_TRACT | Status: DC | PRN
Start: 2022-02-01 — End: 2022-02-01

## 2022-02-01 MED ORDER — METHOCARBAMOL 500 MG PO TABS: 500.0000 mg | ORAL_TABLET | Freq: Three times a day (TID) | ORAL | 0 refills | Status: DC | PRN

## 2022-02-01 NOTE — ED Triage Notes (Signed)
Pt takes lysteda for her fibroids when she has her cycle and did not take last week.

## 2022-02-01 NOTE — ED Triage Notes (Signed)
Pt flew back from [place] last night. While in [place] she started feeling back pain and some shortness of breath. Cant take deep breaths without hurting.

## 2022-02-01 NOTE — Discharge Instructions (Signed)
You were evaluated in the Emergency Department and after careful evaluation, we did not find any emergent condition requiring admission or further testing in the hospital.  Your exam/testing today is overall reassuring.  Symptoms seem to be due to either inflammation of the chest wall or the lining of the lungs.  No signs of blood clots or heart strain.  Recommend use of the Naprosyn twice daily for pain.  You can also use the Robaxin for more significant pain.  Please follow-up on your COVID swab using MyChart.  Please return to the Emergency Department if you experience any worsening of your condition.   Thank you for allowing [place] to be a part of your care.

## 2022-02-01 NOTE — ED Provider Notes (Signed)
[facility] Emergency Department Provider Note MRN:  [id number]  Arrival date & time: 02/01/22     Chief Complaint   Shortness of Breath   History of Present Illness   Carolyn Shaw is a 46 y.o. year-old female with no pertinent past medical history presenting to the ED with chief complaint of shortness of breath.  Patient felt some general malaise a few days ago while traveling.  Then began experiencing some right-sided pleuritic chest and back pain that worsened throughout the day.  No leg pain or swelling, no fever, no cough, no other complaints.  Review of Systems  A thorough review of systems was obtained and all systems are negative except as noted in the HPI and PMH.   Patient's Health History    Past Medical History:  Diagnosis Date   Anxiety    Back pain    Chest pain    Constipation    Depression    Fibroids    Food allergy    GERD (gastroesophageal reflux disease)    Lactose intolerance    Migraines    Seasonal allergies    Vitamin D deficiency     History reviewed. No pertinent surgical history.  Family History  Problem Relation Age of Onset   Diabetes Mother    High blood pressure Mother    Thyroid disease Mother    Sleep apnea Mother    Obesity Mother    Diabetes Father    High blood pressure Father    Stroke Father    Drug abuse Father    Obesity Father    Ataxia Neg Hx    Chorea Neg Hx    Dementia Neg Hx    Mental retardation Neg Hx    Migraines Neg Hx    Multiple sclerosis Neg Hx    Neurofibromatosis Neg Hx    Neuropathy Neg Hx    Parkinsonism Neg Hx    Seizures Neg Hx     Social History   Socioeconomic History   Marital status: Single    Spouse name: Not on file   Number of children: 0   Years of education: Not on file   Highest education level: Master's degree (e.g., MA, MS, MEng, MEd, MSW, MBA)  Occupational History   Occupation: Art therapist)  Tobacco Use   Smoking status: Never    Smokeless tobacco: Never  Vaping Use   Vaping Use: Never used  Substance and Sexual Activity   Alcohol use: No    Alcohol/week: 0.0 standard drinks of alcohol   Drug use: No   Sexual activity: Yes    Partners: Male  Other Topics Concern   Not on file  Social History Narrative   Pt is single she lives alone   Has no children   Left handed   Master Degreed   Drinks tea, no coffee, no soda   Social Determinants of Health   Financial Resource Strain: Not on file  Food Insecurity: Not on file  Transportation Needs: Not on file  Physical Activity: Not on file  Stress: Not on file  Social Connections: Not on file  Intimate Partner Violence: Not on file     Physical Exam   Vitals:   02/01/22 1826 02/01/22 2144  BP: 126/78 122/75  Pulse: 86 70  Resp: 19 17  Temp: 98.4 F (36.9 C)   SpO2: 100% 100%    CONSTITUTIONAL: Well-appearing, NAD NEURO/PSYCH:  Alert and oriented x 3, no focal deficits EYES:  eyes equal and reactive ENT/NECK:  no LAD, no JVD CARDIO: Regular rate, well-perfused, normal S1 and S2 PULM:  CTAB no wheezing or rhonchi GI/GU:  non-distended, non-tender MSK/SPINE:  No gross deformities, no edema, reproducible right-sided chest pain with palpation SKIN:  no rash, atraumatic   *Additional and/or pertinent findings included in MDM below  Diagnostic and Interventional Summary    EKG Interpretation  Date/Time:  Thursday February 01 2022 18:25:33 EDT Ventricular Rate:  74 PR Interval:  126 QRS Duration: 90 QT Interval:  360 QTC Calculation: 399 R Axis:   24 Text Interpretation: Normal sinus rhythm Normal ECG No previous ECGs available No previous ECGs available Confirmed by Gerlene Fee [phone]) on 02/01/2022 10:55:24 PM       Labs Reviewed  CBC - Abnormal; Notable for the following components:      Result Value   MCHC 36.1 (*)    All other components within normal limits  SARS CORONAVIRUS 2 BY RT PCR  BASIC METABOLIC PANEL  D-DIMER,  QUANTITATIVE  TROPONIN I (HIGH SENSITIVITY)  TROPONIN I (HIGH SENSITIVITY)    DG Chest 2 View  Final Result      Medications  naproxen (NAPROSYN) tablet 500 mg (500 mg Oral Given 02/01/22 2323)     Procedures  /  Critical Care Procedures  ED Course and Medical Decision Making  Initial Impression and Ddx Highly favoring MSK pain or pleurisy given the reproducibility on exam.  Recent travel and so some risk for DVT/PE but felt to be low risk.  EKG is reassuring without concerning features.  Labs are without any significant blood count or electrolyte disturbance, troponin is negative, D-dimer is negative.  Patient is appropriate for discharge with reassurance.  Past medical/surgical history that increases complexity of ED encounter: None  Interpretation of Diagnostics I personally reviewed the EKG and my interpretation is as follows: Sinus rhythm    Patient Reassessment and Ultimate Disposition/Management     Discharge home  Patient management required discussion with the following services or consulting groups:  None  Complexity of Problems Addressed Acute illness or injury that poses threat of life of bodily function  Additional Data Reviewed and Analyzed Further history obtained from: None  Additional Factors Impacting ED Encounter Risk Prescriptions  Barth Kirks. [place]onia Small, [place] [email or web address]  Final Clinical Impressions(s) / ED Diagnoses     ICD-10-CM   1. Chest pain, unspecified type  R07.9       ED Discharge Orders          Ordered    naproxen (NAPROSYN) 500 MG tablet  2 times daily        02/01/22 2332    methocarbamol (ROBAXIN) 500 MG tablet  Every 8 hours PRN        02/01/22 2332             Discharge Instructions Discussed with and Provided to Patient:    Discharge Instructions      You were evaluated in the Emergency Department and after careful evaluation, we did not find any  emergent condition requiring admission or further testing in the hospital.  Your exam/testing today is overall reassuring.  Symptoms seem to be due to either inflammation of the chest wall or the lining of the lungs.  No signs of blood clots or heart strain.  Recommend use of the Naprosyn twice daily for pain.  You can also use the Robaxin for more significant pain.  Please follow-up  on your COVID swab using MyChart.  Please return to the Emergency Department if you experience any worsening of your condition.   Thank you for allowing [place] to be a part of your care.      Maudie Flakes, MD 02/01/22 [phone]

## 2022-04-04 ENCOUNTER — Encounter (INDEPENDENT_AMBULATORY_CARE_PROVIDER_SITE_OTHER): Payer: Self-pay

## 2022-04-09 ENCOUNTER — Other Ambulatory Visit: Payer: Self-pay | Admitting: Nurse Practitioner

## 2022-04-09 DIAGNOSIS — Z1231 Encounter for screening mammogram for malignant neoplasm of breast: Secondary | ICD-10-CM

## 2022-06-01 ENCOUNTER — Ambulatory Visit
Admission: RE | Admit: 2022-06-01 | Discharge: 2022-06-01 | Disposition: A | Discharge status: Home / Self Care | Payer: Managed Care, Other (non HMO) | Source: Ambulatory Visit | Attending: Nurse Practitioner | Admitting: Nurse Practitioner

## 2022-06-01 DIAGNOSIS — Z1231 Encounter for screening mammogram for malignant neoplasm of breast: Secondary | ICD-10-CM

## 2022-07-25 NOTE — Progress Notes (Signed)
NEUROLOGY FOLLOW UP OFFICE NOTE  Carolyn Shaw [id number]  Assessment/Plan:   Migraine with aura, without status migrainosus, not intractable, menstrual-related.    Migraine prevention:  not indicated.  Doing well off topiramate. Migraine rescue:  She will try sumatriptan (as her insurance will likely require her to try a second triptan before approving a CGRP inhibitor).  If not effective, will try Nurtec  Limit use of pain relievers to no more than 2 days out of week to prevent risk of rebound or medication-overuse headache.  Keep headache diary  Exercise, hydration, caffeine cessation, sleep hygiene, monitor for and avoid triggers  Follow up 6 months.  Subjective:  Carolyn Shaw is a 46 year old left-handed woman with migraines, uterine fibroids, allergic rhinitis and GERD who follows up for migraines.   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  No family history of aneurysms.  To evaluate for any intracranial aneurysms, she had a CTA of the head on 09/14/2021 which was negative.  Last year, she tapered off of topiramate.  Still doing well overall. Nurtec effective. Intensity:  5/10 Duration:  Up to 2 hours with Maxalt or Excedrin Frequency:  She has had increased stress over the past 2 months.  3 migraines this past month.  1-2 a month before September. Frequency of abortive medication:  2 days a month Since she has been doing well for a while, she was inquiring about discontinuing topiramate.    Rescue protocol:  Excedrin first line, Nurtec second line Current NSAIDS:  None Current analgesics:  Excedrin Current triptans: none Current ergotamine: None Current anti-emetic: Zofran ODT '4mg'$  Current muscle relaxants: None Current anti-anxiolytic: None Current sleep aide: None Current Antihypertensive medications: None Current Antidepressant medications: None Current Anticonvulsant  medications: none Current anti-CGRP: None Current Vitamins/Herbal/Supplements: None Current Antihistamines/Decongestants: None Other therapy: None Hormone/birth control: No   Caffeine:  Very little Diet: Hydrates.  Does skip meals.   Depression: No; Anxiety:  Much improved.  Less stressful job Other pain: No Sleep hygiene: Okay   HISTORY: Onset:  Adolescence but formally diagnosed in 2002 Location:  Left periorbital region Quality:  Squeezing sensation of the eye followed by stabbing and pounding around the eye. Initial Intensity:  7-8/10 Associated symptoms:  photophobia, nausea, phonophobia.  No osmophobia, eye lacrimation or nasal congestion. Aura:  Visual field loss from the left moving to the right side leaving a left field cut briefly Initial Duration:  All day without medication (1-3 hours with medication) Initial Frequency:  Twice a week Activity:  Lay down but able to force self to be active if needed Triggers/aggravating factors:  Bright lights, loud noise, menstrual cycle, dehydration, prolonged screen time, skipped meals Relieving factors:  Laying down.   Past NSAIDs: Ibuprofen, naproxen Past analgesics: Tylenol Past triptans: rizatriptan Past preventative therapy:  topiramate '50mg'$  QHS (helpful)   Family history:  No history of headache  PAST MEDICAL HISTORY: Past Medical History:  Diagnosis Date   Anxiety    Back pain    Chest pain    Constipation    Depression    Fibroids    Food allergy    GERD (gastroesophageal reflux disease)    Lactose intolerance    Migraines    Seasonal allergies    Vitamin D deficiency     MEDICATIONS: Current Outpatient Medications on File Prior to Visit  Medication Sig Dispense Refill   aspirin-acetaminophen-caffeine ([place]) 250-250-65 MG  per tablet Take by mouth every 6 (six) hours as needed for headache or migraine. (Patient not taking: Reported on 07/27/2021)     buPROPion (WELLBUTRIN SR) 150 MG 12 hr tablet  Take 1 tablet (150 mg total) by mouth daily. 30 tablet 1   Cholecalciferol 50 MCG (2000 UT) CAPS Take 1 capsule by mouth daily. OTC     famotidine (PEPCID) 20 MG tablet Take 1 tablet (20 mg total) by mouth 2 (two) times daily. 30 tablet 0   ibuprofen (ADVIL,MOTRIN) 800 MG tablet Take 800 mg by mouth every 8 (eight) hours as needed.     Insulin Pen Needle 32G X 4 MM MISC Use one needles daily to inject Saxenda. (Patient not taking: Reported on 07/27/2021) 100 each 0   Liraglutide -Weight Management (SAXENDA) 18 MG/3ML SOPN Inject 3 mg into the skin daily. 15 mL 0   Melatonin 5 MG CAPS Take 1 capsule (5 mg total) by mouth at bedtime as needed. 30 capsule 0   methocarbamol (ROBAXIN) 500 MG tablet Take 1 tablet (500 mg total) by mouth every 8 (eight) hours as needed for muscle spasms. 30 tablet 0   Multiple Vitamin (MULTIVITAMIN) tablet Take 1 tablet by mouth daily.     naproxen (NAPROSYN) 500 MG tablet Take 1 tablet (500 mg total) by mouth 2 (two) times daily. 30 tablet 0   ondansetron (ZOFRAN ODT) 4 MG disintegrating tablet Take 1 tablet (4 mg total) by mouth every 8 (eight) hours as needed for nausea or vomiting. (Patient not taking: Reported on 07/27/2021) 20 tablet 0   sucralfate (CARAFATE) 1 g tablet Take 1 tablet (1 g total) by mouth 4 (four) times daily -  with meals and at bedtime. 60 tablet 1   topiramate (TOPAMAX) 50 MG tablet Take 1 tablet (50 mg total) by mouth at bedtime. 90 tablet 3   Vitamin D, Ergocalciferol, (DRISDOL) 1.25 MG (50000 UNIT) CAPS capsule Take 1 capsule (50,000 Units total) by mouth every 7 (seven) days. (Patient not taking: Reported on 07/27/2021) 4 capsule 1   No current facility-administered medications on file prior to visit.    ALLERGIES: Allergies  Allergen Reactions   Chocolate Hives   Doxycycline Nausea And Vomiting    Pt sts stomach pain and diarrhea   Erythromycin Nausea And Vomiting    FAMILY HISTORY: Family History  Problem Relation Age of Onset    Diabetes Mother    High blood pressure Mother    Thyroid disease Mother    Sleep apnea Mother    Obesity Mother    Diabetes Father    High blood pressure Father    Stroke Father    Drug abuse Father    Obesity Father    Ataxia Neg Hx    Chorea Neg Hx    Dementia Neg Hx    Mental retardation Neg Hx    Migraines Neg Hx    Multiple sclerosis Neg Hx    Neurofibromatosis Neg Hx    Neuropathy Neg Hx    Parkinsonism Neg Hx    Seizures Neg Hx       Objective:  Blood pressure 126/76, pulse 88, height '5\' 8"'$  (1.727 m), weight 226 lb 12.8 oz (102.9 kg), SpO2 97 %. General: No acute distress.  Patient appears well-groomed.   Head:  Normocephalic/atraumatic Eyes:  Fundi examined but not visualized Neck: supple, no paraspinal tenderness, full range of motion Heart:  Regular rate and rhythm Neurological Exam: alert and oriented to person, place, and  time.  Speech fluent and not dysarthric, language intact.  CN II-XII intact. Bulk and tone normal, muscle strength 5/5 throughout.  Sensation to light touch intact.  Deep tendon reflexes 2+ throughout.  Finger to nose testing intact.  Gait normal, Romberg negative.   Metta Clines, DO  CC: Donald Prose, MD

## 2022-07-26 ENCOUNTER — Other Ambulatory Visit (HOSPITAL_BASED_OUTPATIENT_CLINIC_OR_DEPARTMENT_OTHER): Payer: Self-pay

## 2022-07-26 MED ORDER — LISDEXAMFETAMINE DIMESYLATE 20 MG PO CAPS
20.0000 mg | ORAL_CAPSULE | Freq: Every day | ORAL | 0 refills | Status: DC
  Filled 2022-07-26: qty 30, 30d supply, fill #0

## 2022-07-27 ENCOUNTER — Encounter: Payer: Self-pay | Admitting: Neurology

## 2022-07-27 ENCOUNTER — Ambulatory Visit: Payer: Managed Care, Other (non HMO) | Admitting: Neurology

## 2022-07-27 VITALS — BP 126/76 | HR 88 | Ht 68.0 in | Wt 226.8 lb

## 2022-07-27 DIAGNOSIS — G43109 Migraine with aura, not intractable, without status migrainosus: Secondary | ICD-10-CM | POA: Diagnosis not present

## 2022-07-27 MED ORDER — SUMATRIPTAN SUCCINATE 100 MG PO TABS: ORAL_TABLET | ORAL | 5 refills | Status: DC

## 2022-07-27 NOTE — Patient Instructions (Signed)
Try sumatriptan.  If not effective, will try Nurtec

## 2022-07-31 ENCOUNTER — Other Ambulatory Visit (HOSPITAL_BASED_OUTPATIENT_CLINIC_OR_DEPARTMENT_OTHER): Payer: Self-pay

## 2022-09-06 ENCOUNTER — Other Ambulatory Visit (HOSPITAL_BASED_OUTPATIENT_CLINIC_OR_DEPARTMENT_OTHER): Payer: Self-pay

## 2022-09-06 MED ORDER — ZEPBOUND 2.5 MG/0.5ML ~~LOC~~ SOAJ
2.5000 mg | SUBCUTANEOUS | 0 refills | Status: DC
  Filled 2022-09-06: qty 2, 28d supply, fill #0
  Filled 2022-10-25: qty 6, 84d supply, fill #0

## 2022-09-06 MED ORDER — LISDEXAMFETAMINE DIMESYLATE 20 MG PO CAPS
20.0000 mg | ORAL_CAPSULE | Freq: Every morning | ORAL | 0 refills | Status: DC
  Filled 2022-09-06 – 2022-10-25 (×2): qty 30, 30d supply, fill #0

## 2022-09-13 ENCOUNTER — Other Ambulatory Visit (HOSPITAL_BASED_OUTPATIENT_CLINIC_OR_DEPARTMENT_OTHER): Payer: Self-pay

## 2022-09-14 ENCOUNTER — Other Ambulatory Visit (HOSPITAL_BASED_OUTPATIENT_CLINIC_OR_DEPARTMENT_OTHER): Payer: Self-pay

## 2022-10-25 ENCOUNTER — Other Ambulatory Visit (HOSPITAL_BASED_OUTPATIENT_CLINIC_OR_DEPARTMENT_OTHER): Payer: Self-pay

## 2022-10-25 ENCOUNTER — Other Ambulatory Visit (HOSPITAL_COMMUNITY): Payer: Self-pay

## 2022-10-25 MED ORDER — WEGOVY 0.25 MG/0.5ML ~~LOC~~ SOAJ
0.2500 mg | SUBCUTANEOUS | 1 refills | Status: DC
  Filled 2022-10-25: qty 2, 28d supply, fill #0

## 2022-10-30 ENCOUNTER — Other Ambulatory Visit (HOSPITAL_BASED_OUTPATIENT_CLINIC_OR_DEPARTMENT_OTHER): Payer: Self-pay

## 2022-10-31 ENCOUNTER — Other Ambulatory Visit (HOSPITAL_BASED_OUTPATIENT_CLINIC_OR_DEPARTMENT_OTHER): Payer: Self-pay

## 2022-11-01 ENCOUNTER — Other Ambulatory Visit (HOSPITAL_BASED_OUTPATIENT_CLINIC_OR_DEPARTMENT_OTHER): Payer: Self-pay

## 2022-11-05 ENCOUNTER — Other Ambulatory Visit (HOSPITAL_BASED_OUTPATIENT_CLINIC_OR_DEPARTMENT_OTHER): Payer: Self-pay

## 2023-01-30 NOTE — Progress Notes (Signed)
NEUROLOGY FOLLOW UP OFFICE NOTE  Carolyn Shaw [id number]  Assessment/Plan:   Migraine with aura, without status migrainosus, not intractable, menstrual-related.    Migraine prevention:  not indicated.  Doing well off topiramate. Migraine rescue:  Sumatriptan 100mg   Limit use of pain relievers to no more than 2 days out of week to prevent risk of rebound or medication-overuse headache.  Keep headache diary  Exercise, hydration, caffeine cessation, sleep hygiene, monitor for and avoid triggers  Follow up 6 months.  Subjective:  Carolyn Shaw is a 47 year old left-handed woman with migraines, uterine fibroids, allergic rhinitis and GERD who follows up for migraines.   UPDATE: Doing well Intensity:  5/10 Duration:  Takes sumatriptan and goes to sleep.  Wakes up about 3 hours later and headache is gone.  However, she wakes up feeling zoned out for about an hour.   Frequency:  2 to 3 a month (usually right before menses) Frequency of abortive medication:  2 days a month She is scheduled for laparoscopic myomectomy.   Rescue protocol:  Excedrin first line, Nurtec second line Current NSAIDS:  None Current analgesics:  Excedrin Current triptans: sumatriptan 100mg  Current ergotamine: None Current anti-emetic: Zofran ODT 4mg  Current muscle relaxants: None Current anti-anxiolytic: None Current sleep aide: None Current Antihypertensive medications: None Current Antidepressant medications: None Current Anticonvulsant medications: none Current anti-CGRP: None Current Vitamins/Herbal/Supplements: None Current Antihistamines/Decongestants: None Other therapy: None Hormone/birth control: No   Caffeine:  Very little Diet: Hydrates.  Does skip meals.   Depression: No; Anxiety:  Much improved.  Less stressful job Other pain: No Sleep hygiene: Okay   HISTORY: Onset:  Adolescence but formally diagnosed in 2002 Location:  Left periorbital region Quality:  Squeezing sensation  of the eye followed by stabbing and pounding around the eye. Initial Intensity:  7-8/10 Associated symptoms:  photophobia, nausea, phonophobia.  No osmophobia, eye lacrimation or nasal congestion. Aura:  Visual field loss from the left moving to the right side leaving a left field cut briefly Initial Duration:  All day without medication (1-3 hours with medication) Initial Frequency:  Twice a week Activity:  Lay down but able to force self to be active if needed Triggers/aggravating factors:  Bright lights, loud noise, menstrual cycle, dehydration, prolonged screen time, skipped meals Relieving factors:  Laying down.  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  No family history of aneurysms.  To evaluate for any intracranial aneurysms, she had a CTA of the head on 09/14/2021 which was negative.   Past NSAIDs: Ibuprofen, naproxen Past analgesics: Tylenol Past triptans: rizatriptan Past CGRP inhibitor:  Nurtec PRN Past preventative therapy:  topiramate 50mg  QHS (helpful)   Family history:  No history of headache  PAST MEDICAL HISTORY: Past Medical History:  Diagnosis Date   Anxiety    Back pain    Chest pain    Constipation    Depression    Fibroids    Food allergy    GERD (gastroesophageal reflux disease)    Lactose intolerance    Migraines    Seasonal allergies    Vitamin D deficiency     MEDICATIONS: Current Outpatient Medications on File Prior to Visit  Medication Sig Dispense Refill   aspirin-acetaminophen-caffeine (EXCEDRIN MIGRAINE) 250-250-65 MG per tablet Take by mouth every 6 (six) hours as needed for headache or migraine. (Patient not taking: Reported on 07/27/2021)     buPROPion (WELLBUTRIN SR) 150 MG 12  hr tablet Take 1 tablet (150 mg total) by mouth daily. 30 tablet 1   Cholecalciferol 50 MCG (2000 UT) CAPS Take 1 capsule by mouth daily. OTC     famotidine (PEPCID) 20 MG tablet Take 1 tablet  (20 mg total) by mouth 2 (two) times daily. 30 tablet 0   ibuprofen (ADVIL,MOTRIN) 800 MG tablet Take 800 mg by mouth every 8 (eight) hours as needed.     Insulin Pen Needle 32G X 4 MM MISC Use one needles daily to inject Saxenda. (Patient not taking: Reported on 07/27/2021) 100 each 0   Liraglutide -Weight Management (SAXENDA) 18 MG/3ML SOPN Inject 3 mg into the skin daily. 15 mL 0   Melatonin 5 MG CAPS Take 1 capsule (5 mg total) by mouth at bedtime as needed. 30 capsule 0   methocarbamol (ROBAXIN) 500 MG tablet Take 1 tablet (500 mg total) by mouth every 8 (eight) hours as needed for muscle spasms. 30 tablet 0   Multiple Vitamin (MULTIVITAMIN) tablet Take 1 tablet by mouth daily.     naproxen (NAPROSYN) 500 MG tablet Take 1 tablet (500 mg total) by mouth 2 (two) times daily. 30 tablet 0   ondansetron (ZOFRAN ODT) 4 MG disintegrating tablet Take 1 tablet (4 mg total) by mouth every 8 (eight) hours as needed for nausea or vomiting. (Patient not taking: Reported on 07/27/2021) 20 tablet 0   sucralfate (CARAFATE) 1 g tablet Take 1 tablet (1 g total) by mouth 4 (four) times daily -  with meals and at bedtime. 60 tablet 1   topiramate (TOPAMAX) 50 MG tablet Take 1 tablet (50 mg total) by mouth at bedtime. 90 tablet 3   Vitamin D, Ergocalciferol, (DRISDOL) 1.25 MG (50000 UNIT) CAPS capsule Take 1 capsule (50,000 Units total) by mouth every 7 (seven) days. (Patient not taking: Reported on 07/27/2021) 4 capsule 1   No current facility-administered medications on file prior to visit.    ALLERGIES: Allergies  Allergen Reactions   Chocolate Hives   Doxycycline Nausea And Vomiting    Pt sts stomach pain and diarrhea   Erythromycin Nausea And Vomiting    FAMILY HISTORY: Family History  Problem Relation Age of Onset   Diabetes Mother    High blood pressure Mother    Thyroid disease Mother    Sleep apnea Mother    Obesity Mother    Diabetes Father    High blood pressure Father    Stroke Father     Drug abuse Father    Obesity Father    Ataxia Neg Hx    Chorea Neg Hx    Dementia Neg Hx    Mental retardation Neg Hx    Migraines Neg Hx    Multiple sclerosis Neg Hx    Neurofibromatosis Neg Hx    Neuropathy Neg Hx    Parkinsonism Neg Hx    Seizures Neg Hx       Objective:  Blood pressure 121/75, pulse 90, height 5\' 5"  (1.651 m), weight 268 lb (121.6 kg), SpO2 98 %. General: No acute distress.  Patient appears well-groomed.      Shon Millet, DO  CC: Deatra James, MD

## 2023-02-01 ENCOUNTER — Ambulatory Visit: Payer: Managed Care, Other (non HMO) | Admitting: Neurology

## 2023-02-01 ENCOUNTER — Encounter: Payer: Self-pay | Admitting: Neurology

## 2023-02-01 VITALS — BP 121/75 | HR 90 | Ht 65.0 in | Wt 268.0 lb

## 2023-02-01 DIAGNOSIS — G43109 Migraine with aura, not intractable, without status migrainosus: Secondary | ICD-10-CM | POA: Diagnosis not present

## 2023-02-01 MED ORDER — SUMATRIPTAN SUCCINATE 100 MG PO TABS: ORAL_TABLET | ORAL | 5 refills | Status: DC

## 2023-02-01 NOTE — Patient Instructions (Signed)
Sumatriptan as needed.  Limit use of pain relievers to no more than 2 days out of week to prevent risk of rebound or medication-overuse headache. Keep headache diary

## 2023-02-04 ENCOUNTER — Other Ambulatory Visit (HOSPITAL_BASED_OUTPATIENT_CLINIC_OR_DEPARTMENT_OTHER): Payer: Self-pay

## 2023-02-04 MED ORDER — QULIPTA 60 MG PO TABS
60.0000 mg | ORAL_TABLET | Freq: Every day | ORAL | 0 refills | Status: DC
  Filled 2023-02-04: qty 30, 30d supply, fill #0

## 2023-02-05 ENCOUNTER — Other Ambulatory Visit (HOSPITAL_BASED_OUTPATIENT_CLINIC_OR_DEPARTMENT_OTHER): Payer: Self-pay

## 2023-02-18 ENCOUNTER — Other Ambulatory Visit (HOSPITAL_BASED_OUTPATIENT_CLINIC_OR_DEPARTMENT_OTHER): Payer: Self-pay

## 2023-02-25 ENCOUNTER — Other Ambulatory Visit (HOSPITAL_BASED_OUTPATIENT_CLINIC_OR_DEPARTMENT_OTHER): Payer: Self-pay

## 2023-03-17 ENCOUNTER — Other Ambulatory Visit (HOSPITAL_BASED_OUTPATIENT_CLINIC_OR_DEPARTMENT_OTHER): Payer: Self-pay

## 2023-03-21 ENCOUNTER — Other Ambulatory Visit (HOSPITAL_BASED_OUTPATIENT_CLINIC_OR_DEPARTMENT_OTHER): Payer: Self-pay

## 2023-03-21 MED ORDER — PANTOPRAZOLE SODIUM 40 MG PO TBEC
40.0000 mg | DELAYED_RELEASE_TABLET | Freq: Every day | ORAL | 0 refills | Status: AC
  Filled 2023-03-21: qty 90, 90d supply, fill #0

## 2023-03-21 MED ORDER — LISDEXAMFETAMINE DIMESYLATE 20 MG PO CAPS
20.0000 mg | ORAL_CAPSULE | Freq: Every morning | ORAL | 0 refills | Status: DC
  Filled 2023-03-21: qty 30, 30d supply, fill #0

## 2023-03-22 ENCOUNTER — Other Ambulatory Visit: Payer: Self-pay

## 2023-03-23 ENCOUNTER — Other Ambulatory Visit (HOSPITAL_BASED_OUTPATIENT_CLINIC_OR_DEPARTMENT_OTHER): Payer: Self-pay

## 2023-03-28 ENCOUNTER — Other Ambulatory Visit (HOSPITAL_BASED_OUTPATIENT_CLINIC_OR_DEPARTMENT_OTHER): Payer: Self-pay

## 2023-04-26 ENCOUNTER — Other Ambulatory Visit: Payer: Self-pay | Admitting: Nurse Practitioner

## 2023-04-26 DIAGNOSIS — Z1231 Encounter for screening mammogram for malignant neoplasm of breast: Secondary | ICD-10-CM

## 2023-06-03 ENCOUNTER — Other Ambulatory Visit (HOSPITAL_BASED_OUTPATIENT_CLINIC_OR_DEPARTMENT_OTHER): Payer: Self-pay

## 2023-06-03 ENCOUNTER — Ambulatory Visit
Admission: RE | Admit: 2023-06-03 | Discharge: 2023-06-03 | Disposition: A | Discharge status: Home / Self Care | Payer: Managed Care, Other (non HMO) | Source: Ambulatory Visit | Attending: Nurse Practitioner | Admitting: Nurse Practitioner

## 2023-06-03 DIAGNOSIS — Z1231 Encounter for screening mammogram for malignant neoplasm of breast: Secondary | ICD-10-CM

## 2023-06-03 MED ORDER — LISDEXAMFETAMINE DIMESYLATE 20 MG PO CAPS
20.0000 mg | ORAL_CAPSULE | Freq: Every morning | ORAL | 0 refills | Status: DC
  Filled 2023-06-03: qty 30, 30d supply, fill #0

## 2023-06-14 ENCOUNTER — Other Ambulatory Visit (HOSPITAL_BASED_OUTPATIENT_CLINIC_OR_DEPARTMENT_OTHER): Payer: Self-pay

## 2023-08-05 NOTE — Progress Notes (Unsigned)
NEUROLOGY FOLLOW UP OFFICE NOTE  Carolyn Shaw [id number]  Assessment/Plan:   Migraine with aura, without status migrainosus, not intractable, menstrual-related.    Migraine prevention:  not indicated.  *** Migraine rescue:  Sumatriptan 100mg  ***  Limit use of pain relievers to no more than 2 days out of week to prevent risk of rebound or medication-overuse headache.  Keep headache diary  Exercise, hydration, caffeine cessation, sleep hygiene, monitor for and avoid triggers  Follow up 6 months. ***  Subjective:  Carolyn Shaw is a 47 year old left-handed woman with migraines, uterine fibroids, allergic rhinitis and GERD who follows up for migraines.   UPDATE: Doing well *** Intensity:  5/10 Duration:  Takes sumatriptan and goes to sleep.  Wakes up about 3 hours later and headache is gone.  However, she wakes up feeling zoned out for about an hour.   Frequency:  2 to 3 a month (usually right before menses) Frequency of abortive medication:  2 days a month She is scheduled for laparoscopic myomectomy.   Rescue protocol:  Excedrin first line, Nurtec second line Current NSAIDS:  None Current analgesics:  Excedrin Current triptans: sumatriptan 100mg  Current ergotamine: None Current anti-emetic: Zofran ODT 4mg  Current muscle relaxants: None Current anti-anxiolytic: None Current sleep aide: None Current Antihypertensive medications: None Current Antidepressant medications: None Current Anticonvulsant medications: none Current anti-CGRP: None Current Vitamins/Herbal/Supplements: None Current Antihistamines/Decongestants: None Other therapy: None Hormone/birth control: No   Caffeine:  Very little Diet: Hydrates.  Does skip meals.   Depression: No; Anxiety:  Much improved.  Less stressful job Other pain: No Sleep hygiene: Okay   HISTORY: Onset:  Adolescence but formally diagnosed in 2002 Location:  Left periorbital region Quality:  Squeezing sensation of the eye  followed by stabbing and pounding around the eye. Initial Intensity:  7-8/10 Associated symptoms:  photophobia, nausea, phonophobia.  No osmophobia, eye lacrimation or nasal congestion. Aura:  Visual field loss from the left moving to the right side leaving a left field cut briefly Initial Duration:  All day without medication (1-3 hours with medication) Initial Frequency:  Twice a week Activity:  Lay down but able to force self to be active if needed Triggers/aggravating factors:  Bright lights, loud noise, menstrual cycle, dehydration, prolonged screen time, skipped meals Relieving factors:  Laying down.  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  No family history of aneurysms.  To evaluate for any intracranial aneurysms, she had a CTA of the head on 09/14/2021 which was negative.   Past NSAIDs: Ibuprofen, naproxen Past analgesics: Tylenol Past triptans: rizatriptan Past CGRP inhibitor:  Nurtec PRN Past preventative therapy:  topiramate 50mg  QHS (helpful)   Family history:  No history of headache  PAST MEDICAL HISTORY: Past Medical History:  Diagnosis Date   Anxiety    Back pain    Chest pain    Constipation    Depression    Fibroids    Food allergy    GERD (gastroesophageal reflux disease)    Lactose intolerance    Migraines    Seasonal allergies    Vitamin D deficiency     MEDICATIONS: Current Outpatient Medications on File Prior to Visit  Medication Sig Dispense Refill   Atogepant (QULIPTA) 60 MG TABS Take 1 tablet (60 mg total) by mouth daily. 30 tablet 0   Cholecalciferol 50 MCG (2000 UT) CAPS Take 1 capsule by mouth daily. OTC  ibuprofen (ADVIL,MOTRIN) 800 MG tablet Take 800 mg by mouth every 8 (eight) hours as needed.     Insulin Pen Needle 32G X 4 MM MISC Use one needles daily to inject Saxenda. 100 each 0   lisdexamfetamine (VYVANSE) 20 MG capsule Take 1 capsule (20 mg total) by mouth in  the morning. 30 capsule 0   lisdexamfetamine (VYVANSE) 20 MG capsule Take 1 capsule (20 mg total) by mouth every morning. 30 capsule 0   Melatonin 5 MG CAPS Take 1 capsule (5 mg total) by mouth at bedtime as needed. 30 capsule 0   Multiple Vitamin (MULTIVITAMIN) tablet Take 1 tablet by mouth daily.     naproxen (NAPROSYN) 500 MG tablet Take 1 tablet (500 mg total) by mouth 2 (two) times daily. 30 tablet 0   ondansetron (ZOFRAN ODT) 4 MG disintegrating tablet Take 1 tablet (4 mg total) by mouth every 8 (eight) hours as needed for nausea or vomiting. 20 tablet 0   pantoprazole (PROTONIX) 40 MG tablet Take 1 tablet (40 mg total) by mouth daily. 90 tablet 0   Semaglutide (OZEMPIC, 0.25 OR 0.5 MG/DOSE, ) Inject into the skin. (Patient not taking: Reported on 02/01/2023)     Semaglutide-Weight Management (WEGOVY) 0.25 MG/0.5ML SOAJ Inject 0.25 mg into the skin once a week. (Patient not taking: Reported on 02/01/2023) 2 mL 1   sucralfate (CARAFATE) 1 g tablet Take 1 tablet (1 g total) by mouth 4 (four) times daily -  with meals and at bedtime. 60 tablet 1   SUMAtriptan (IMITREX) 100 MG tablet Take 1/2 tablet to 1 tablet earliest onset of migraine.  May repeat after 2 hours. Maximum 2 doses in 24 hours. 10 tablet 5   tirzepatide (ZEPBOUND) 2.5 MG/0.5ML Pen Inject 2.5 mg into the skin once a week. (Patient not taking: Reported on 02/01/2023) 6 mL 0   tranexamic acid (LYSTEDA) 650 MG TABS tablet Take 1,300 mg by mouth 3 (three) times daily.     Vitamin D, Ergocalciferol, (DRISDOL) 1.25 MG (50000 UNIT) CAPS capsule Take 1 capsule (50,000 Units total) by mouth every 7 (seven) days. 4 capsule 1   No current facility-administered medications on file prior to visit.    ALLERGIES: Allergies  Allergen Reactions   Chocolate Hives   Doxycycline Nausea And Vomiting    Pt sts stomach pain and diarrhea   Erythromycin Nausea And Vomiting    FAMILY HISTORY: Family History  Problem Relation Age of Onset   Diabetes  Mother    High blood pressure Mother    Thyroid disease Mother    Sleep apnea Mother    Obesity Mother    Diabetes Father    High blood pressure Father    Stroke Father    Drug abuse Father    Obesity Father    Ataxia Neg Hx    Chorea Neg Hx    Dementia Neg Hx    Mental retardation Neg Hx    Migraines Neg Hx    Multiple sclerosis Neg Hx    Neurofibromatosis Neg Hx    Neuropathy Neg Hx    Parkinsonism Neg Hx    Seizures Neg Hx       Objective:  *** General: No acute distress.  Patient appears well-groomed.   Head:  Normocephalic/atraumatic Neck:  Supple.  No paraspinal tenderness.  Full range of motion. Heart:  Regular rate and rhythm. Neuro:  Alert and oriented.  Speech fluent and not dysarthric.  Language intact.  CN II-XII intact.  Bulk and tone normal.  Muscle strength 5/5 throughout.  Deep tendon reflexes 2+ throughout.  Gait normal.  Romberg negative.    Shon Millet, DO  CC: Deatra James, MD

## 2023-08-06 ENCOUNTER — Ambulatory Visit: Payer: Managed Care, Other (non HMO) | Admitting: Neurology

## 2023-08-06 ENCOUNTER — Encounter: Payer: Self-pay | Admitting: Neurology

## 2023-08-06 VITALS — BP 129/75 | HR 84 | Ht 65.0 in | Wt 226.0 lb

## 2023-08-06 DIAGNOSIS — G43109 Migraine with aura, not intractable, without status migrainosus: Secondary | ICD-10-CM

## 2023-08-06 MED ORDER — NURTEC 75 MG PO TBDP: 1.0000 | ORAL_TABLET | Freq: Every day | ORAL | 11 refills | Status: DC | PRN

## 2023-08-06 NOTE — Patient Instructions (Signed)
Nurtec once daily as needed.  May still use sumatriptan if needed.

## 2023-08-06 NOTE — Progress Notes (Signed)
NEUROLOGY FOLLOW UP OFFICE NOTE  Carolyn Shaw [id number]  Assessment/Plan:   Migraine with aura, without status migrainosus, not intractable, menstrual-related.    Migraine prevention:  not indicated.  Migraine rescue:  Nurtec (I suspect it should be approved now).  May still use sumatriptan 100mg  if needed.  Limit use of pain relievers to no more than 2 days out of week to prevent risk of rebound or medication-overuse headache.  Keep headache diary  Exercise, hydration, caffeine cessation, sleep hygiene, monitor for and avoid triggers  Follow up 9 months.  Subjective:  Carolyn Shaw is a 47 year old left-handed woman with migraines, uterine fibroids, allergic rhinitis and GERD who follows up for migraines.   UPDATE: Doing well She had laparoscopic myomectomy Intensity:  5/10 (first one severe) Duration:  Takes sumatriptan and goes to sleep.  Wakes up about 3 hours later and headache is gone.  However, she wakes up feeling zoned out for about an hour.  Frequency:  2 to 3 a month (usually right before menses)    Rescue protocol:  Tylenol powder first line, sumatriptan second line Current NSAIDS:  None Current analgesics:  Tylenol Current triptans: sumatriptan 100mg  Current ergotamine: None Current anti-emetic: Zofran ODT 4mg  Current muscle relaxants: None Current anti-anxiolytic: None Current sleep aide: None Current Antihypertensive medications: None Current Antidepressant medications: None Current Anticonvulsant medications: none Current anti-CGRP: None Current Vitamins/Herbal/Supplements: None Current Antihistamines/Decongestants: None Other therapy: None Hormone/birth control: No   Caffeine:  Very little Diet: Hydrates.  Does skip meals.   Depression: No; Anxiety:  Much improved.  Less stressful job Other pain: No Sleep hygiene: Okay   HISTORY: Onset:  Adolescence but formally diagnosed in 2002 Location:  Left periorbital region Quality:  Squeezing  sensation of the eye followed by stabbing and pounding around the eye. Initial Intensity:  7-8/10 Associated symptoms:  photophobia, nausea, phonophobia.  No osmophobia, eye lacrimation or nasal congestion. Aura:  Visual field loss from the left moving to the right side leaving a left field cut briefly Initial Duration:  All day without medication (1-3 hours with medication) Initial Frequency:  Twice a week Activity:  Lay down but able to force self to be active if needed Triggers/aggravating factors:  Bright lights, loud noise, menstrual cycle, dehydration, prolonged screen time, skipped meals Relieving factors:  Laying down.  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  No family history of aneurysms.  To evaluate for any intracranial aneurysms, she had a CTA of the head on 09/14/2021 which was negative.   Past NSAIDs: Ibuprofen, naproxen Past analgesics: Tylenol Past triptans: rizatriptan Past CGRP inhibitor:  Nurtec PRN, Qulipta  Past preventative therapy:  topiramate 50mg  QHS (helpful)   Family history:  No history of headache  PAST MEDICAL HISTORY: Past Medical History:  Diagnosis Date   Anxiety    Back pain    Chest pain    Constipation    Depression    Fibroids    Food allergy    GERD (gastroesophageal reflux disease)    Lactose intolerance    Migraines    Seasonal allergies    Vitamin D deficiency     MEDICATIONS: Current Outpatient Medications on File Prior to Visit  Medication Sig Dispense Refill   Atogepant (QULIPTA) 60 MG TABS Take 1 tablet (60 mg total) by mouth daily. 30 tablet 0   Cholecalciferol 50 MCG (2000 UT) CAPS Take 1 capsule by mouth daily.  OTC     ibuprofen (ADVIL,MOTRIN) 800 MG tablet Take 800 mg by mouth every 8 (eight) hours as needed.     Insulin Pen Needle 32G X 4 MM MISC Use one needles daily to inject Saxenda. 100 each 0   lisdexamfetamine (VYVANSE) 20 MG capsule Take 1  capsule (20 mg total) by mouth in the morning. 30 capsule 0   lisdexamfetamine (VYVANSE) 20 MG capsule Take 1 capsule (20 mg total) by mouth every morning. 30 capsule 0   Melatonin 5 MG CAPS Take 1 capsule (5 mg total) by mouth at bedtime as needed. 30 capsule 0   Multiple Vitamin (MULTIVITAMIN) tablet Take 1 tablet by mouth daily.     naproxen (NAPROSYN) 500 MG tablet Take 1 tablet (500 mg total) by mouth 2 (two) times daily. 30 tablet 0   ondansetron (ZOFRAN ODT) 4 MG disintegrating tablet Take 1 tablet (4 mg total) by mouth every 8 (eight) hours as needed for nausea or vomiting. 20 tablet 0   pantoprazole (PROTONIX) 40 MG tablet Take 1 tablet (40 mg total) by mouth daily. 90 tablet 0   Semaglutide (OZEMPIC, 0.25 OR 0.5 MG/DOSE, [place]) Inject into the skin. (Patient not taking: Reported on 02/01/2023)     Semaglutide-Weight Management (WEGOVY) 0.25 MG/0.5ML SOAJ Inject 0.25 mg into the skin once a week. (Patient not taking: Reported on 02/01/2023) 2 mL 1   sucralfate (CARAFATE) 1 g tablet Take 1 tablet (1 g total) by mouth 4 (four) times daily -  with meals and at bedtime. 60 tablet 1   SUMAtriptan (IMITREX) 100 MG tablet Take 1/2 tablet to 1 tablet earliest onset of migraine.  May repeat after 2 hours. Maximum 2 doses in 24 hours. 10 tablet 5   tirzepatide (ZEPBOUND) 2.5 MG/0.5ML Pen Inject 2.5 mg into the skin once a week. (Patient not taking: Reported on 02/01/2023) 6 mL 0   tranexamic acid (LYSTEDA) 650 MG TABS tablet Take 1,300 mg by mouth 3 (three) times daily.     Vitamin D, Ergocalciferol, (DRISDOL) 1.25 MG (50000 UNIT) CAPS capsule Take 1 capsule (50,000 Units total) by mouth every 7 (seven) days. 4 capsule 1   No current facility-administered medications on file prior to visit.    ALLERGIES: Allergies  Allergen Reactions   Chocolate Hives   Doxycycline Nausea And Vomiting    Pt sts stomach pain and diarrhea   Erythromycin Nausea And Vomiting    FAMILY HISTORY: Family History  Problem  Relation Age of Onset   Diabetes Mother    High blood pressure Mother    Thyroid disease Mother    Sleep apnea Mother    Obesity Mother    Diabetes Father    High blood pressure Father    Stroke Father    Drug abuse Father    Obesity Father    Ataxia Neg Hx    Chorea Neg Hx    Dementia Neg Hx    Mental retardation Neg Hx    Migraines Neg Hx    Multiple sclerosis Neg Hx    Neurofibromatosis Neg Hx    Neuropathy Neg Hx    Parkinsonism Neg Hx    Seizures Neg Hx       Objective:  Blood pressure 129/75, pulse 84, height 5\' 5"  (1.651 m), weight 268 lb (121.6 kg), SpO2 95%. General: No acute distress.  Patient appears well-groomed.   Head:  Normocephalic/atraumatic Neck:  Supple.  No paraspinal tenderness.  Full range of motion. Heart:  Regular  rate and rhythm. Neuro:  Alert and oriented.  Speech fluent and not dysarthric.  Language intact.  CN II-XII intact.  Bulk and tone normal.  Muscle strength 5/5 throughout.  Deep tendon reflexes 2+ throughout.  Gait normal.  Romberg negative.    Shon Millet, DO  CC: Deatra James, MD

## 2023-08-08 ENCOUNTER — Telehealth: Payer: Self-pay

## 2023-08-08 NOTE — Telephone Encounter (Signed)
Per fax from [facility], Script needs the number of headaches.   Spoke to rep at pharmacy script went through no longer needed.

## 2023-08-12 ENCOUNTER — Other Ambulatory Visit (HOSPITAL_BASED_OUTPATIENT_CLINIC_OR_DEPARTMENT_OTHER): Payer: Self-pay

## 2023-08-12 MED ORDER — LISDEXAMFETAMINE DIMESYLATE 20 MG PO CAPS
20.0000 mg | ORAL_CAPSULE | Freq: Every day | ORAL | 0 refills | Status: DC
  Filled 2023-08-12 – 2023-08-27 (×2): qty 30, 30d supply, fill #0

## 2023-08-22 ENCOUNTER — Other Ambulatory Visit (HOSPITAL_BASED_OUTPATIENT_CLINIC_OR_DEPARTMENT_OTHER): Payer: Self-pay

## 2023-08-27 ENCOUNTER — Other Ambulatory Visit (HOSPITAL_BASED_OUTPATIENT_CLINIC_OR_DEPARTMENT_OTHER): Payer: Self-pay

## 2023-10-31 ENCOUNTER — Telehealth: Payer: Self-pay | Admitting: Pharmacy Technician

## 2023-10-31 ENCOUNTER — Other Ambulatory Visit (HOSPITAL_COMMUNITY): Payer: Self-pay

## 2023-10-31 NOTE — Telephone Encounter (Signed)
 Pharmacy Patient Advocate Encounter   Received notification from CoverMyMeds that prior authorization for NURTEC 75MG  is required/requested.   Insurance verification completed.   The patient is insured through [facility] .   Per test claim: PA required; PA submitted to above mentioned insurance via CoverMyMeds Key/confirmation #/EOC [id number] Status is pending

## 2023-10-31 NOTE — Telephone Encounter (Signed)
 Pharmacy Patient Advocate Encounter  Received notification from [facility] that Prior Authorization for NURTEC 75MG  has been APPROVED from 3.6.25 to 3.6.26. Ran test claim, Copay is $0. This test claim was processed through [facility]- copay amounts may vary at other pharmacies due to pharmacy/plan contracts, or as the patient moves through the different stages of their insurance plan.   PA #/Case ID/Reference #: [id number]

## 2023-12-26 ENCOUNTER — Other Ambulatory Visit (HOSPITAL_BASED_OUTPATIENT_CLINIC_OR_DEPARTMENT_OTHER): Payer: Self-pay

## 2023-12-26 MED ORDER — LISDEXAMFETAMINE DIMESYLATE 30 MG PO CAPS
30.0000 mg | ORAL_CAPSULE | Freq: Every day | ORAL | 0 refills | Status: DC
  Filled 2023-12-26: qty 30, 30d supply, fill #0

## 2024-01-04 ENCOUNTER — Other Ambulatory Visit (HOSPITAL_BASED_OUTPATIENT_CLINIC_OR_DEPARTMENT_OTHER): Payer: Self-pay

## 2024-01-04 MED ORDER — PEG 3350-KCL-NA BICARB-NACL 420 G PO SOLR
ORAL | 0 refills | Status: AC
  Filled 2024-01-04: qty 4000, 1d supply, fill #0

## 2024-01-04 MED ORDER — BISACODYL EC 5 MG PO TBEC
DELAYED_RELEASE_TABLET | ORAL | 0 refills | Status: DC
  Filled 2024-01-04: qty 4, 1d supply, fill #0

## 2024-01-23 ENCOUNTER — Other Ambulatory Visit (HOSPITAL_BASED_OUTPATIENT_CLINIC_OR_DEPARTMENT_OTHER): Payer: Self-pay

## 2024-01-23 MED ORDER — LISDEXAMFETAMINE DIMESYLATE 30 MG PO CAPS
30.0000 mg | ORAL_CAPSULE | Freq: Every morning | ORAL | 0 refills | Status: AC
  Filled 2024-04-29: qty 30, 30d supply, fill #0

## 2024-02-20 ENCOUNTER — Other Ambulatory Visit (HOSPITAL_BASED_OUTPATIENT_CLINIC_OR_DEPARTMENT_OTHER): Payer: Self-pay

## 2024-02-20 MED ORDER — LISDEXAMFETAMINE DIMESYLATE 30 MG PO CAPS
30.0000 mg | ORAL_CAPSULE | Freq: Every morning | ORAL | 0 refills | Status: DC
  Filled 2024-02-20: qty 30, 30d supply, fill #0

## 2024-03-02 ENCOUNTER — Other Ambulatory Visit (HOSPITAL_BASED_OUTPATIENT_CLINIC_OR_DEPARTMENT_OTHER): Payer: Self-pay

## 2024-04-20 ENCOUNTER — Other Ambulatory Visit: Payer: Self-pay | Admitting: Nurse Practitioner

## 2024-04-20 DIAGNOSIS — Z1231 Encounter for screening mammogram for malignant neoplasm of breast: Secondary | ICD-10-CM

## 2024-04-23 ENCOUNTER — Other Ambulatory Visit (HOSPITAL_BASED_OUTPATIENT_CLINIC_OR_DEPARTMENT_OTHER): Payer: Self-pay

## 2024-04-23 MED ORDER — CLOTRIMAZOLE-BETAMETHASONE 1-0.05 % EX CREA
TOPICAL_CREAM | CUTANEOUS | 1 refills | Status: AC
  Filled 2024-04-23: qty 30, 14d supply, fill #0

## 2024-04-29 ENCOUNTER — Other Ambulatory Visit (HOSPITAL_BASED_OUTPATIENT_CLINIC_OR_DEPARTMENT_OTHER): Payer: Self-pay

## 2024-05-04 ENCOUNTER — Other Ambulatory Visit (HOSPITAL_BASED_OUTPATIENT_CLINIC_OR_DEPARTMENT_OTHER): Payer: Self-pay

## 2024-05-04 MED ORDER — LISDEXAMFETAMINE DIMESYLATE 30 MG PO CAPS: 30.0000 mg | ORAL_CAPSULE | Freq: Every morning | ORAL | 0 refills | Status: AC

## 2024-05-05 NOTE — Progress Notes (Unsigned)
 NEUROLOGY FOLLOW UP OFFICE NOTE  Carolyn Shaw [id number]  Assessment/Plan:   Migraine with aura, without status migrainosus, not intractable, menstrual-related.    Migraine prevention:  not indicated.  Migraine rescue:  Nurtec  May still use sumatriptan  100mg  if needed.  Limit use of pain relievers to no more than 2 days out of week to prevent risk of rebound or medication-overuse headache.  Keep headache diary  Exercise, hydration, caffeine cessation, sleep hygiene, monitor for and avoid triggers  Follow up 1 year ***  Subjective:  Carolyn Shaw is a 48 year old left-handed woman with migraines, uterine fibroids, allergic rhinitis and GERD who follows up for migraines.   UPDATE: Doing well Intensity:  5/10 (first one severe) Duration:  *** with Nurtec Frequency:  2 to 3 a month (usually right before menses)    Rescue protocol:  Tylenol powder first line, sumatriptan  second line Current NSAIDS:  None Current analgesics:  Tylenol Current triptans: sumatriptan  100mg  *** Current ergotamine: None Current anti-emetic: Zofran  ODT 4mg  Current muscle relaxants: None Current anti-anxiolytic: None Current sleep aide: None Current Antihypertensive medications: None Current Antidepressant medications: None Current Anticonvulsant medications: none Current anti-CGRP: Nurtec PRN  Current Vitamins/Herbal/Supplements: None Current Antihistamines/Decongestants: None Other therapy: None Hormone/birth control: No   Caffeine:  Very little Diet: Hydrates.  Does skip meals.   Depression: No; Anxiety:  Much improved.  Less stressful job Other pain: No Sleep hygiene: Okay   HISTORY: Onset:  Adolescence but formally diagnosed in 2002 Location:  Left periorbital region Quality:  Squeezing sensation of the eye followed by stabbing and pounding around the eye. Initial Intensity:  7-8/10 Associated symptoms:  photophobia, nausea, phonophobia.  No osmophobia, eye lacrimation or  nasal congestion. Aura:  Visual field loss from the left moving to the right side leaving a left field cut briefly Initial Duration:  All day without medication (1-3 hours with medication) Initial Frequency:  Twice a week Activity:  Lay down but able to force self to be active if needed Triggers/aggravating factors:  Bright lights, loud noise, menstrual cycle, dehydration, prolonged screen time, skipped meals Relieving factors:  Laying down.  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  No family history of aneurysms.  To evaluate for any intracranial aneurysms, she had a CTA of the head on 09/14/2021 which was negative.   Past NSAIDs: Ibuprofen, naproxen  Past analgesics: Tylenol Past triptans: rizatriptan  Past CGRP inhibitor:  Nurtec PRN, Qulipta   Past preventative therapy:  topiramate  50mg  QHS (helpful)   Family history:  No history of headache  PAST MEDICAL HISTORY: Past Medical History:  Diagnosis Date   Anxiety    Back pain    Chest pain    Constipation    Depression    Fibroids    Food allergy    GERD (gastroesophageal reflux disease)    Lactose intolerance    Migraines    Seasonal allergies    Vitamin D  deficiency     MEDICATIONS: Current Outpatient Medications on File Prior to Visit  Medication Sig Dispense Refill   bisacodyl  5 MG EC tablet Use as directed. 4 tablet 0   Cholecalciferol 50 MCG (2000 UT) CAPS Take 1 capsule by mouth daily. OTC     clotrimazole -betamethasone  (LOTRISONE ) cream Apply thin layer beneath breasts Externally Up to twice daily as needed 30 g 1   ibuprofen (ADVIL,MOTRIN) 800 MG tablet Take 800 mg by mouth every 8 (eight) hours as  needed.     Insulin  Pen Needle 32G X 4 MM MISC Use one needles daily to inject Saxenda . 100 each 0   lisdexamfetamine (VYVANSE ) 20 MG capsule Take 1 capsule (20 mg total) by mouth in the morning. 30 capsule 0   lisdexamfetamine (VYVANSE ) 20 MG  capsule Take 1 capsule (20 mg total) by mouth every morning. 30 capsule 0   lisdexamfetamine (VYVANSE ) 20 MG capsule Take 1 capsule (20 mg total) by mouth daily. 30 capsule 0   lisdexamfetamine (VYVANSE ) 30 MG capsule Take 1 capsule (30 mg total) by mouth in the morning. 30 capsule 0   lisdexamfetamine (VYVANSE ) 30 MG capsule Take 1 capsule (30 mg total) by mouth every morning. 30 capsule 0   lisdexamfetamine (VYVANSE ) 30 MG capsule Take 1 capsule (30 mg total) by mouth every morning. 30 capsule 0   Melatonin 5 MG CAPS Take 1 capsule (5 mg total) by mouth at bedtime as needed. 30 capsule 0   Multiple Vitamin (MULTIVITAMIN) tablet Take 1 tablet by mouth daily.     naproxen  (NAPROSYN ) 500 MG tablet Take 1 tablet (500 mg total) by mouth 2 (two) times daily. 30 tablet 0   ondansetron  (ZOFRAN  ODT) 4 MG disintegrating tablet Take 1 tablet (4 mg total) by mouth every 8 (eight) hours as needed for nausea or vomiting. 20 tablet 0   pantoprazole  (PROTONIX ) 40 MG tablet Take 1 tablet (40 mg total) by mouth daily. 90 tablet 0   polyethylene glycol-electrolytes (NULYTELY) 420 g solution Use as directed. 4000 mL 0   Rimegepant Sulfate  (NURTEC) 75 MG TBDP Take 1 tablet (75 mg total) by mouth daily as needed. 8 tablet 11   Semaglutide  (OZEMPIC , 0.25 OR 0.5 MG/DOSE, [place]) Inject into the skin.     Semaglutide -Weight Management (WEGOVY ) 0.25 MG/0.5ML SOAJ Inject 0.25 mg into the skin once a week. 2 mL 1   sucralfate  (CARAFATE ) 1 g tablet Take 1 tablet (1 g total) by mouth 4 (four) times daily -  with meals and at bedtime. 60 tablet 1   tirzepatide  (ZEPBOUND ) 2.5 MG/0.5ML Pen Inject 2.5 mg into the skin once a week. 6 mL 0   tranexamic acid (LYSTEDA) 650 MG TABS tablet Take 1,300 mg by mouth 3 (three) times daily.     Vitamin D , Ergocalciferol , (DRISDOL ) 1.25 MG (50000 UNIT) CAPS capsule Take 1 capsule (50,000 Units total) by mouth every 7 (seven) days. 4 capsule 1   No current facility-administered medications on  file prior to visit.    ALLERGIES: Allergies  Allergen Reactions   Chocolate Hives   Doxycycline Nausea And Vomiting    Pt sts stomach pain and diarrhea   Erythromycin Nausea And Vomiting    FAMILY HISTORY: Family History  Problem Relation Age of Onset   Diabetes Mother    High blood pressure Mother    Thyroid disease Mother    Sleep apnea Mother    Obesity Mother    Diabetes Father    High blood pressure Father    Stroke Father    Drug abuse Father    Obesity Father    Ataxia Neg Hx    Chorea Neg Hx    Dementia Neg Hx    Mental retardation Neg Hx    Migraines Neg Hx    Multiple sclerosis Neg Hx    Neurofibromatosis Neg Hx    Neuropathy Neg Hx    Parkinsonism Neg Hx    Seizures Neg Hx       Objective:  ***  General: No acute distress.  Patient appears well-groomed.   Head:  Normocephalic/atraumatic Neck:  Supple.  No paraspinal tenderness.  Full range of motion. Heart:  Regular rate and rhythm. Neuro:  Alert and oriented.  Speech fluent and not dysarthric.  Language intact.  CN II-XII intact.  Bulk and tone normal.  Muscle strength 5/5 throughout.  Sensation to light touch intact.  Deep tendon reflexes 2+ throughout, toes downgoing.  Gait normal.  Romberg negative.     Juliene Dunnings, DO  CC: Vyvyan Sun, MD

## 2024-05-06 ENCOUNTER — Ambulatory Visit: Payer: Self-pay | Admitting: Neurology

## 2024-05-06 ENCOUNTER — Other Ambulatory Visit (HOSPITAL_BASED_OUTPATIENT_CLINIC_OR_DEPARTMENT_OTHER): Payer: Self-pay

## 2024-05-06 VITALS — BP 111/74 | HR 85 | Ht 65.0 in | Wt 236.0 lb

## 2024-05-06 DIAGNOSIS — G43829 Menstrual migraine, not intractable, without status migrainosus: Secondary | ICD-10-CM

## 2024-05-06 MED ORDER — NURTEC 75 MG PO TBDP
1.0000 | ORAL_TABLET | Freq: Every day | ORAL | 11 refills | Status: AC | PRN
  Filled 2024-05-06 – 2024-06-02 (×2): qty 8, 30d supply, fill #0

## 2024-05-16 ENCOUNTER — Other Ambulatory Visit (HOSPITAL_BASED_OUTPATIENT_CLINIC_OR_DEPARTMENT_OTHER): Payer: Self-pay

## 2024-06-02 ENCOUNTER — Other Ambulatory Visit (HOSPITAL_BASED_OUTPATIENT_CLINIC_OR_DEPARTMENT_OTHER): Payer: Self-pay

## 2024-06-05 ENCOUNTER — Ambulatory Visit
Admission: RE | Admit: 2024-06-05 | Discharge: 2024-06-05 | Disposition: A | Discharge status: Home / Self Care | Source: Ambulatory Visit | Attending: Nurse Practitioner | Admitting: Nurse Practitioner

## 2024-06-05 DIAGNOSIS — Z1231 Encounter for screening mammogram for malignant neoplasm of breast: Secondary | ICD-10-CM

## 2024-08-03 ENCOUNTER — Other Ambulatory Visit (HOSPITAL_BASED_OUTPATIENT_CLINIC_OR_DEPARTMENT_OTHER): Payer: Self-pay

## 2024-08-03 MED ORDER — LISDEXAMFETAMINE DIMESYLATE 30 MG PO CAPS
30.0000 mg | ORAL_CAPSULE | Freq: Every morning | ORAL | 0 refills | Status: AC
  Filled 2024-08-03 – 2024-09-15 (×2): qty 30, 30d supply, fill #0

## 2024-08-13 ENCOUNTER — Other Ambulatory Visit (HOSPITAL_BASED_OUTPATIENT_CLINIC_OR_DEPARTMENT_OTHER): Payer: Self-pay

## 2024-09-15 ENCOUNTER — Other Ambulatory Visit (HOSPITAL_BASED_OUTPATIENT_CLINIC_OR_DEPARTMENT_OTHER): Payer: Self-pay

## 2025-05-06 ENCOUNTER — Ambulatory Visit: Admitting: Neurology
